# Patient Record
Sex: Male | Born: 1955 | Race: Black or African American | Hispanic: No | Marital: Married | Smoking: Never smoker
Health system: Southern US, Community
[De-identification: ages and names within clinical notes are randomized; demographics above are authoritative.]

## PROBLEM LIST (undated history)

## (undated) DIAGNOSIS — I10 Essential (primary) hypertension: Secondary | ICD-10-CM

## (undated) DIAGNOSIS — R569 Unspecified convulsions: Secondary | ICD-10-CM

## (undated) DIAGNOSIS — N400 Enlarged prostate without lower urinary tract symptoms: Secondary | ICD-10-CM

## (undated) DIAGNOSIS — H409 Unspecified glaucoma: Secondary | ICD-10-CM

## (undated) HISTORY — DX: Unspecified convulsions: R56.9

## (undated) HISTORY — DX: Benign prostatic hyperplasia without lower urinary tract symptoms: N40.0

## (undated) HISTORY — PX: HERNIA REPAIR: SHX51

## (undated) HISTORY — DX: Essential (primary) hypertension: I10

## (undated) HISTORY — DX: Unspecified glaucoma: H40.9

---

## 2002-04-10 ENCOUNTER — Encounter: Admission: RE | Admit: 2002-04-10 | Discharge: 2002-04-10 | Payer: Self-pay | Admitting: Family Medicine

## 2002-04-10 ENCOUNTER — Encounter: Payer: Self-pay | Admitting: Family Medicine

## 2004-06-21 ENCOUNTER — Emergency Department (HOSPITAL_COMMUNITY): Admission: EM | Admit: 2004-06-21 | Discharge: 2004-06-21 | Payer: Self-pay | Admitting: Emergency Medicine

## 2004-07-15 ENCOUNTER — Emergency Department (HOSPITAL_COMMUNITY): Admission: EM | Admit: 2004-07-15 | Discharge: 2004-07-15 | Payer: Self-pay | Admitting: Emergency Medicine

## 2006-05-12 IMAGING — CR DG ABDOMEN ACUTE W/ 1V CHEST
4 series · 4 of 4 positions shown · non-contrast
Comparison: No prior studies.

CLINICAL DATA: Right-sided abdominal pain and vomiting. 
ACUTE ABDOMEN SERIES:

[w chest pa]
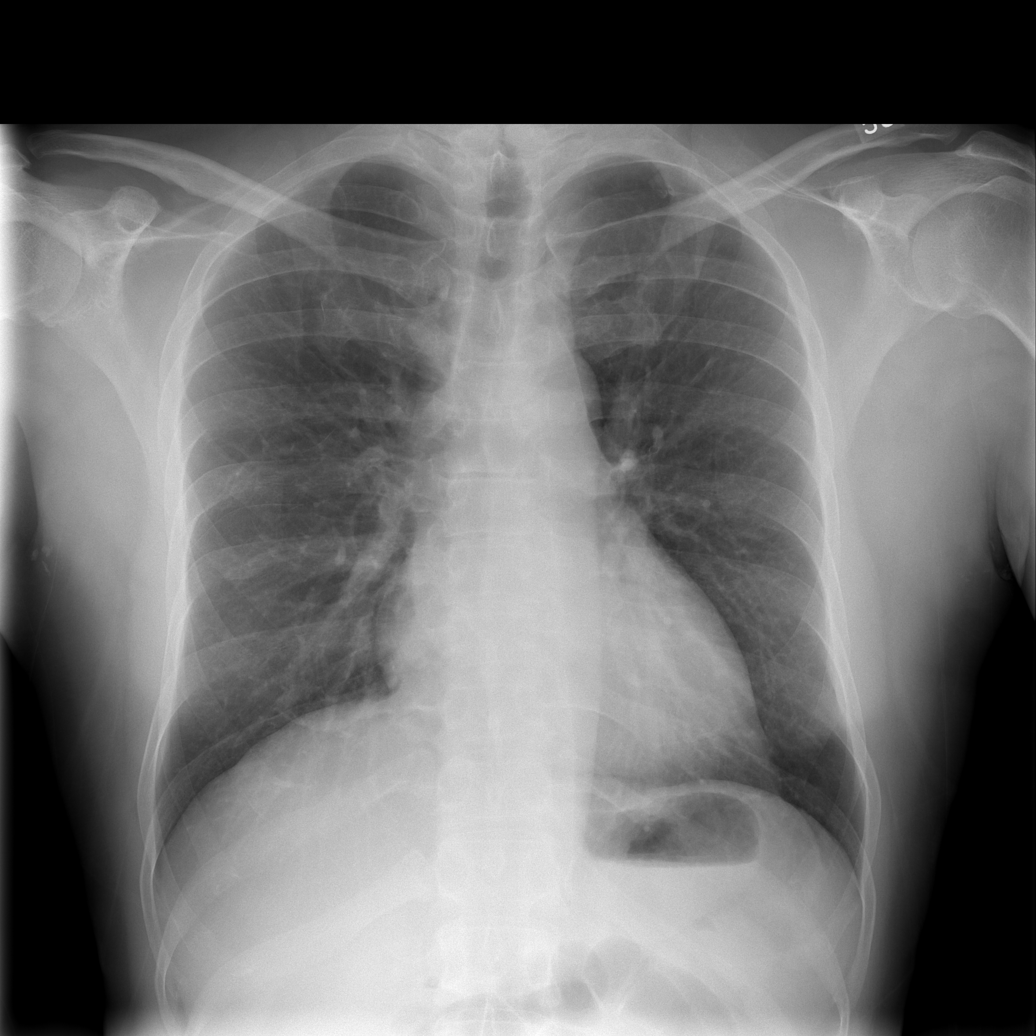

[w abdomen upright]
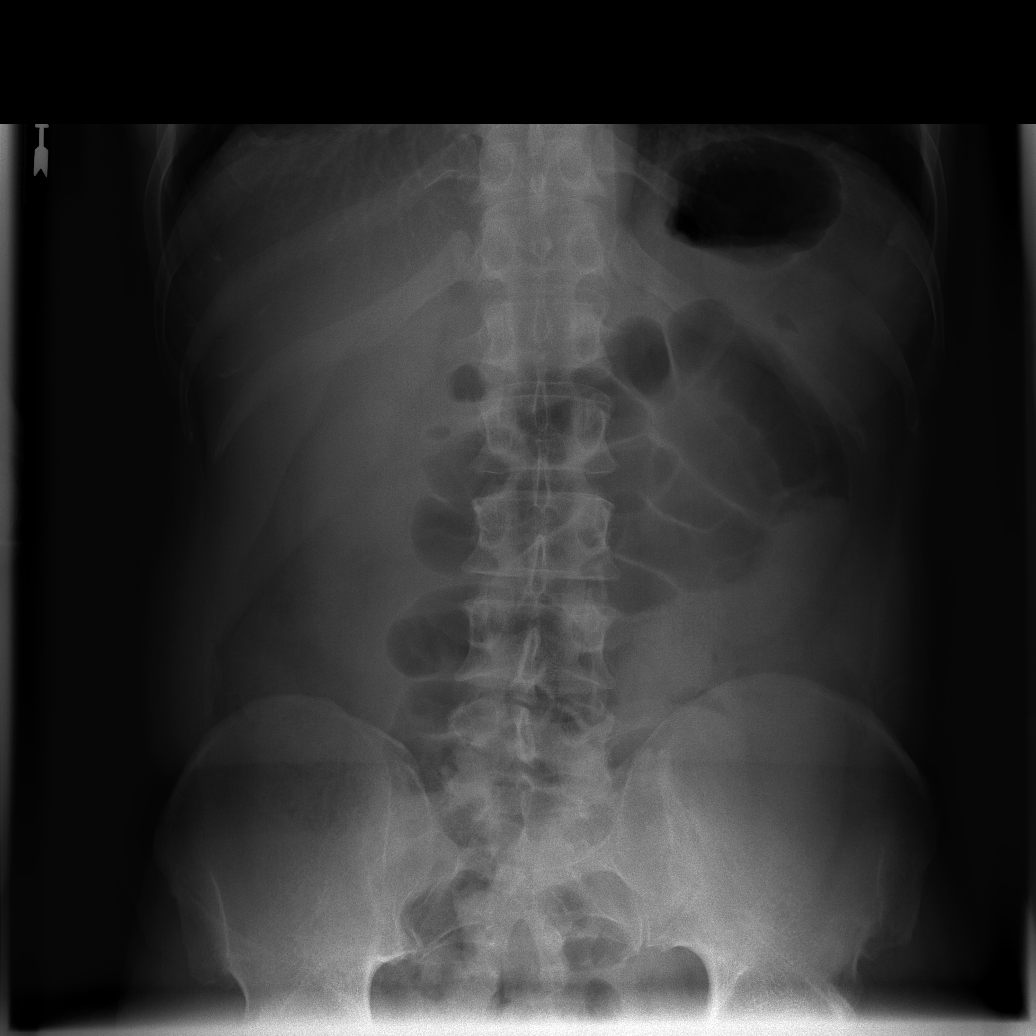

[t abdomen supine (1 of 2)]
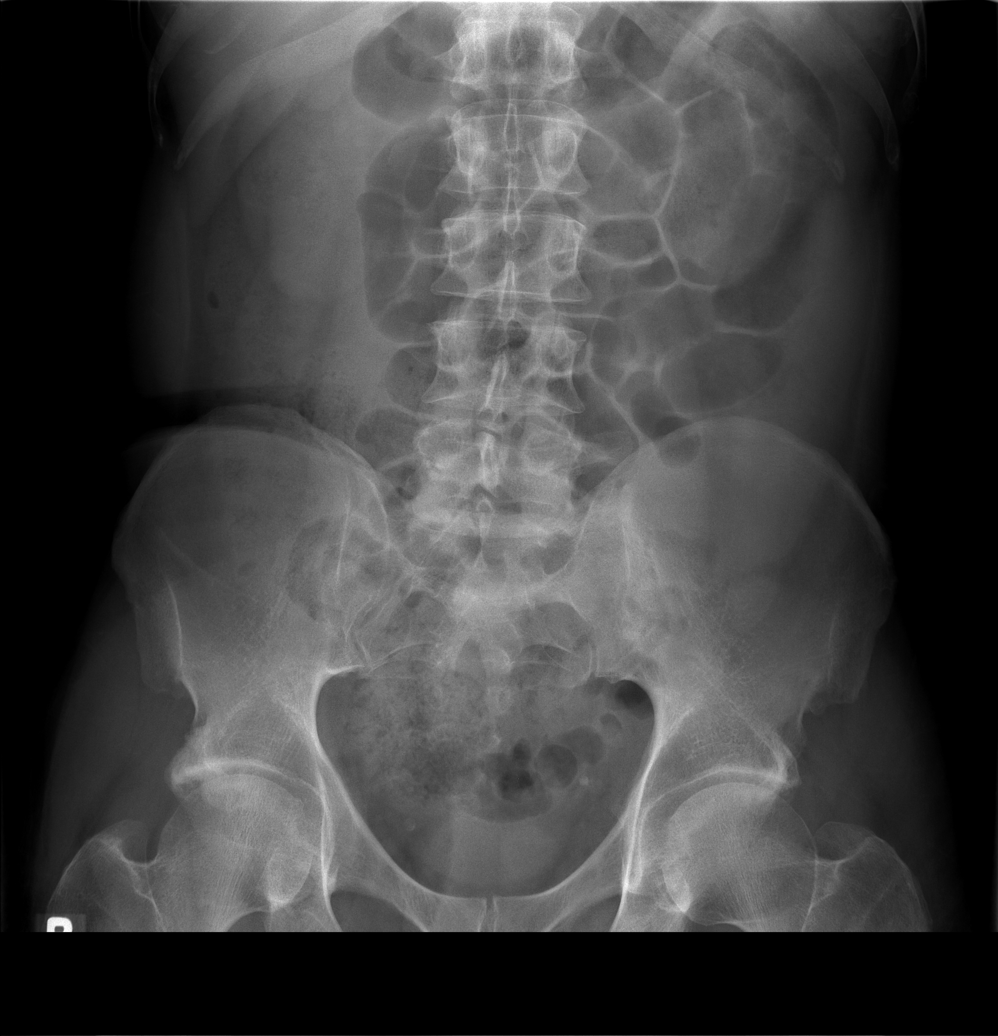

[t abdomen supine (2 of 2)]
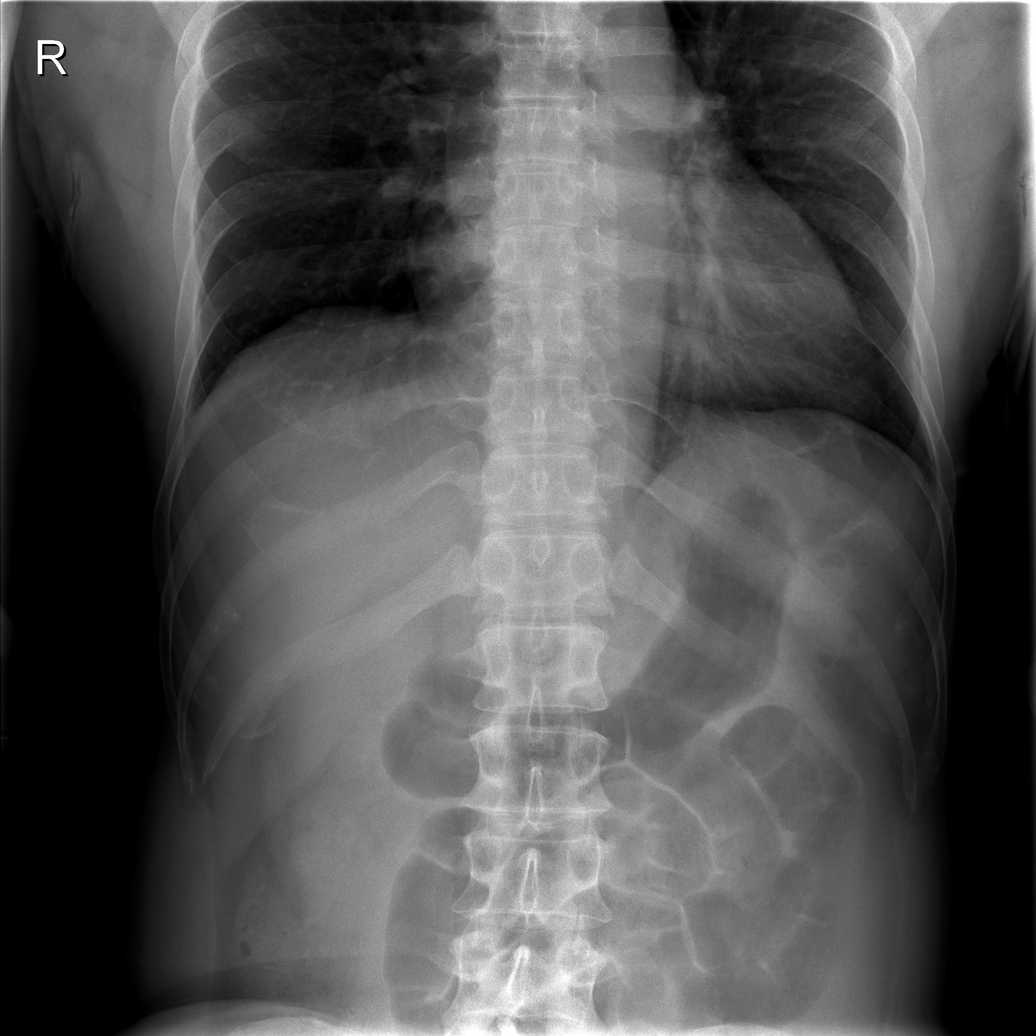

[4 of 4 positions shown; findings below may reference images not displayed]

FINDINGS: Heart and mediastinum appear unremarkable.   No evidence of free intraperitoneal gas beneath the hemidiaphragms. 
There are multiple air-filled nondilated loops of small bowel in the mid-abdomen.  There is evidence of stool in the colon.  The fat planes of the abdomen are well demonstrated and thus the centralized bowel loops are unlikely to be due to ascites.  No appendicolith is evident.  There is mild sclerosis of the right acetabulum laterally which is likely degenerative in nature.
IMPRESSION: Centralized nondilated loops of air-filled small bowel.  This is likely incidental.  If the degree of the patient?s abdominal pain warrants, CT may be helpful in further characterization.

## 2013-04-03 ENCOUNTER — Encounter: Payer: Self-pay | Admitting: Nurse Practitioner

## 2013-04-09 ENCOUNTER — Encounter: Payer: Self-pay | Admitting: Nurse Practitioner

## 2013-04-09 ENCOUNTER — Ambulatory Visit (INDEPENDENT_AMBULATORY_CARE_PROVIDER_SITE_OTHER): Payer: 59 | Admitting: Nurse Practitioner

## 2013-04-09 ENCOUNTER — Encounter (INDEPENDENT_AMBULATORY_CARE_PROVIDER_SITE_OTHER): Payer: Self-pay

## 2013-04-09 VITALS — BP 124/74 | HR 87 | Ht 73.75 in | Wt 212.0 lb

## 2013-04-09 DIAGNOSIS — G40209 Localization-related (focal) (partial) symptomatic epilepsy and epileptic syndromes with complex partial seizures, not intractable, without status epilepticus: Secondary | ICD-10-CM | POA: Insufficient documentation

## 2013-04-09 MED ORDER — CARBAMAZEPINE ER 300 MG PO CP12: 300.0000 mg | ORAL_CAPSULE | Freq: Two times a day (BID) | ORAL | Status: DC

## 2013-04-09 NOTE — Patient Instructions (Addendum)
Continue Carbitrol  brand 300mg  twice daily Call for any seizure activity F/U yearly and prn

## 2013-04-09 NOTE — Progress Notes (Signed)
[facility]  PATIENT: Darryl Fletcher DOB: 18-Jan-1956   REASON FOR VISIT: follow up for seizure   HISTORY OF PRESENT ILLNESS: Darryl Fletcher, 57 year old black male returns for followup. He has a history of complex partial seizure disorder currently well controlled on Carbatrol brand and no seizure activity in 15 years. His labs are drawn by his primary care doctor , Dr. Wynelle Link and he claims they were within normal limits at last check this year. He denies any balance issues he continues to work out 3 times a week he has not had any staring spells, any episodes of confusion, no daytime drowsiness. No new neurologic complaints.    HISTORY: He has a history of complex partial seizure disorder with no seizure activity in 55 years. Currently on Carbatrol tolerating the medication without any side effects. He has been on Dilantin and phenobarbital. Dilantin was discontinued due to problems with his gums. Denies any staring spells, any periods of confusion, no falls,no daytime drowsiness ,occassional dizziness, no headaches.  He has had no sleep disturbanc. See ROS.   REVIEW OF SYSTEMS: Full 14 system review of systems performed and notable only for those listed, all others are neg:  Constitutional: N/A  Cardiovascular: N/A  Ear/Nose/Throat: N/A  Skin: N/A  Eyes: N/A  Respiratory: N/A  Gastroitestinal: N/A  Hematology/Lymphatic: N/A  Endocrine: N/A Musculoskeletal:hip pain, aching muscles Allergy/Immunology: N/A  Neurological: N/A Psychiatric: N/A   ALLERGIES: No Known Allergies  HOME MEDICATIONS: Outpatient Prescriptions Prior to Visit  Medication Sig Dispense Refill  . carbamazepine (CARBATROL) 300 MG 12 hr capsule Take 300 mg by mouth 2 (two) times daily.      . dorzolamide-timolol (COSOPT) 22.3-6.8 MG/ML ophthalmic solution 1 drop 2 (two) times daily.      . hydrochlorothiazide (HYDRODIURIL) 25 MG tablet Take 25 mg by mouth daily.      . Travoprost, BAK Free,  (TRAVATAN) 0.004 % SOLN ophthalmic solution 1 drop at bedtime.      Marland Kitchen amLODipine (NORVASC) 10 MG tablet Take 10 mg by mouth daily.       No facility-administered medications prior to visit.    PAST MEDICAL HISTORY: Past Medical History  Diagnosis Date  . High blood pressure   . Seizures     PAST SURGICAL HISTORY: Past Surgical History  Procedure Laterality Date  . Hernia repair      FAMILY HISTORY: Family History  Problem Relation Age of Onset  . Colon cancer Father   . Diabetes Sister     SOCIAL HISTORY: History   Social History  . Marital Status: Married    Spouse Name: Byrd Hesselbach    Number of Children: 3  . Years of Education: 12   Occupational History  .  [employer]   Social History Main Topics  . Smoking status: Never Smoker   . Smokeless tobacco: Never Used  . Alcohol Use: No  . Drug Use: No  . Sexual Activity: Not on file   Other Topics Concern  . Not on file   Social History Narrative   Patient is married and lives at home with his wife and    Patient has three children.   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   Patient has a high school education.   Patient drinks caffeine- 3-4 times per week.           PHYSICAL EXAM  Filed Vitals:   04/09/13 1033  BP: 124/74  Pulse: 87  Height:  6' 1.75" (1.873 m)  Weight: 212 lb (96.163 kg)   Body mass index is 27.41 kg/(m^2).  Generalized: Well developed, in no acute distress  Neurological examination   Mentation: Alert oriented to time, place, history taking. Follows all commands speech and language fluent  Cranial nerve II-XII: Pupils were equal round reactive to light extraocular movements were full, visual field were full on confrontational test. Facial sensation and strength were normal. hearing was intact to finger rubbing bilaterally. Uvula tongue midline. head turning and shoulder shrug were normal and symmetric.Tongue protrusion into cheek strength was normal. Motor:  normal bulk and tone, full strength in the BUE, BLE, fine finger movements normal, no pronator drift. No focal weakness Coordination: finger-nose-finger, heel-to-shin bilaterally, no dysmetria Reflexes: Brachioradialis 2/2, biceps 2/2, triceps 2/2, patellar 2/2, Achilles 2/2, plantar responses were flexor bilaterally. Gait and Station: Rising up from seated position without assistance, normal stance,  moderate stride, good arm swing, smooth turning, able to perform tiptoe, and heel walking without difficulty. Tandem gait is steady  DIAGNOSTIC DATA (LABS, IMAGING, TESTING) None to review in EPIC patient to send [place] recent labs from Dr. Wynelle Link.   ASSESSMENT AND PLAN  57 y.o. year old male  has a past medical history of High blood pressure and Seizures. here to followup for seizure disorder. No seizures in 15 years.  Continue Carbitrol  brand 300mg  twice daily Call for any seizure activity F/U yearly and prn Nilda Riggs, [facility], [facility], APRN  [facility] [address], [address] [place], [place] [address] [phone]

## 2013-09-24 ENCOUNTER — Telehealth: Payer: Self-pay | Admitting: Nurse Practitioner

## 2013-09-24 MED ORDER — CARBAMAZEPINE ER 300 MG PO CP12: 300.0000 mg | ORAL_CAPSULE | Freq: Two times a day (BID) | ORAL | Status: DC

## 2013-09-24 NOTE — Telephone Encounter (Signed)
Patient requesting 30 day refill of carbamazepine (CARBATROL) 300 MG 12 hr capsule Rx sent to [place]  Going out of town and [facility] hasn't shipped medication.

## 2013-09-24 NOTE — Telephone Encounter (Signed)
Rx has been sent  

## 2014-01-28 ENCOUNTER — Telehealth: Payer: Self-pay | Admitting: *Deleted

## 2014-01-28 MED ORDER — CARBAMAZEPINE ER 300 MG PO CP12: 300.0000 mg | ORAL_CAPSULE | Freq: Two times a day (BID) | ORAL | Status: DC

## 2014-01-28 NOTE — Telephone Encounter (Signed)
Patient calling in to state that his pharmacy [facility] is taking a long time to get his medication Carbetrol 300 mg. Patient would like a Rx to last him til the end of the week until he gets his medication.

## 2014-01-28 NOTE — Telephone Encounter (Signed)
RX sent to local pharmacy. Please call patient

## 2014-01-29 NOTE — Telephone Encounter (Signed)
Called patient and relayed RX was sent.

## 2014-04-08 ENCOUNTER — Ambulatory Visit: Payer: Self-pay | Admitting: Nurse Practitioner

## 2014-04-27 ENCOUNTER — Other Ambulatory Visit: Payer: Self-pay | Admitting: Nurse Practitioner

## 2014-04-29 ENCOUNTER — Telehealth: Payer: Self-pay | Admitting: Nurse Practitioner

## 2014-04-29 MED ORDER — CARBAMAZEPINE ER 300 MG PO CP12: 300.0000 mg | ORAL_CAPSULE | Freq: Two times a day (BID) | ORAL | Status: DC

## 2014-04-29 NOTE — Telephone Encounter (Signed)
Patient stated Rx has expired for CARBATROL 300 MG 12 hr capsule.  Please forward new Rx refill to [place].  Please call and advise.

## 2014-04-29 NOTE — Telephone Encounter (Signed)
Rx has been sent.  I called back, phone rang several times with no answer, no voicemail.  Unable to leave message.

## 2014-05-25 ENCOUNTER — Other Ambulatory Visit: Payer: Self-pay | Admitting: Nurse Practitioner

## 2014-05-27 ENCOUNTER — Ambulatory Visit (INDEPENDENT_AMBULATORY_CARE_PROVIDER_SITE_OTHER): Payer: 59 | Admitting: Nurse Practitioner

## 2014-05-27 ENCOUNTER — Encounter: Payer: Self-pay | Admitting: Nurse Practitioner

## 2014-05-27 VITALS — BP 139/85 | HR 64 | Ht 73.0 in | Wt 218.0 lb

## 2014-05-27 DIAGNOSIS — G40209 Localization-related (focal) (partial) symptomatic epilepsy and epileptic syndromes with complex partial seizures, not intractable, without status epilepticus: Secondary | ICD-10-CM

## 2014-05-27 DIAGNOSIS — Z5181 Encounter for therapeutic drug level monitoring: Secondary | ICD-10-CM

## 2014-05-27 MED ORDER — CARBATROL 300 MG PO CP12: 300.0000 mg | ORAL_CAPSULE | Freq: Two times a day (BID) | ORAL | Status: DC

## 2014-05-27 NOTE — Progress Notes (Signed)
[facility]  PATIENT: Darryl Fletcher DOB: [birth year]   REASON FOR VISIT: Follow-up for seizure disorder , complex partial HISTORY FROM: Patient    HISTORY OF PRESENT ILLNESS:Darryl Fletcher, 59 year old black male returns for followup. He was last seen in this office 04/09/2013. He has a history of complex partial seizure disorder currently well controlled on Carbatrol brand and no seizure activity in 16.5 years. His labs are drawn by his primary care doctor , Dr. Tamasha Laplante Fletcher and he claims they were within normal limits at last check.  He denies any balance issues he continues to work out 3 times a week he has not had any staring spells, any episodes of confusion, no daytime drowsiness. He denies any side effects to the Carbatrol. He returns for reevaluation and monitoring of his blood level. No new neurologic complaints.    HISTORY: He has a history of complex partial seizure disorder with no seizure activity in 14 years. Currently on Carbatrol tolerating the medication without any side effects. He has been on Dilantin and phenobarbital. Dilantin was discontinued due to problems with his gums. Denies any staring spells, any periods of confusion, no falls,no daytime drowsiness ,occassional dizziness, no headaches. He has had no sleep disturbanc. See ROS.     REVIEW OF SYSTEMS: Full 14 system review of systems performed and notable only for those listed, all others are neg:  Constitutional: neg  Cardiovascular: neg Ear/Nose/Throat: neg  Skin: neg Eyes: neg Respiratory: neg Gastroitestinal: neg  Hematology/Lymphatic: neg  Endocrine: neg Musculoskeletal:neg Allergy/Immunology: neg Neurological: neg Psychiatric: neg Sleep : neg   ALLERGIES: No Known Allergies  HOME MEDICATIONS: Outpatient Prescriptions Prior to Visit  Medication Sig Dispense Refill  . amLODipine-benazepril (LOTREL) 10-20 MG per capsule 1 capsule daily.    Marland Kitchen CARBATROL 300 MG 12 hr capsule TAKE 1 CAPSULE  TWICE A DAY 60 capsule 0  . dorzolamide-timolol (COSOPT) 22.3-6.8 MG/ML ophthalmic solution 1 drop 2 (two) times daily.    . hydrochlorothiazide (HYDRODIURIL) 25 MG tablet Take 25 mg by mouth daily.    . Travoprost, BAK Free, (TRAVATAN) 0.004 % SOLN ophthalmic solution 1 drop at bedtime.     No facility-administered medications prior to visit.    PAST MEDICAL HISTORY: Past Medical History  Diagnosis Date  . High blood pressure   . Seizures     PAST SURGICAL HISTORY: Past Surgical History  Procedure Laterality Date  . Hernia repair      FAMILY HISTORY: Family History  Problem Relation Age of Onset  . Colon cancer Father   . Diabetes Sister     SOCIAL HISTORY: History   Social History  . Marital Status: Married    Spouse Name: Darryl Fletcher    Number of Children: 3  . Years of Education: 12   Occupational History  .  Darryl Fletcher   Social History Main Topics  . Smoking status: Never Smoker   . Smokeless tobacco: Never Used  . Alcohol Use: No  . Drug Use: No  . Sexual Activity: Not on file   Other Topics Concern  . Not on file   Social History Narrative   Patient is married and lives at home with his wife and    Patient has three children.   Patient works as a Administrator for [employer].   Patient has a high school education.   Patient drinks caffeine- 3-4 times per week.           PHYSICAL EXAM  Filed Vitals:  05/27/14 1525  BP: 139/85  Pulse: 64  Height: 6\' 1"  (1.854 m)  Weight: 218 lb (98.884 kg)   Body mass index is 28.77 kg/(m^2).  Generalized: Well developed, in no acute distress  Head: normocephalic and atraumatic,. Oropharynx benign  Neck: Supple, no carotid bruits  Cardiac: Regular rate rhythm, no murmur  Musculoskeletal: No deformity   Neurological examination   Mentation: Alert oriented to time, place, history taking. Attention span and concentration appropriate. Recent and remote memory intact.  Follows all commands speech  and language fluent.   Cranial nerve II-XII: Fundoscopic exam reveals sharp disc margins.Pupils were equal round reactive to light extraocular movements were full, visual field were full on confrontational test. Facial sensation and strength were normal. hearing was intact to finger rubbing bilaterally. Uvula tongue midline. head turning and shoulder shrug were normal and symmetric.Tongue protrusion into cheek strength was normal. Motor: normal bulk and tone, full strength in the BUE, BLE, fine finger movements normal, no pronator drift. No focal weakness Sensory: normal and symmetric to light touch, pinprick, and  Vibration, proprioception  Coordination: finger-nose-finger, heel-to-shin bilaterally, no dysmetria Reflexes: Brachioradialis 2/2, biceps 2/2, triceps 2/2, patellar 2/2, Achilles 2/2, plantar responses were flexor bilaterally. Gait and Station: Rising up from seated position without assistance, normal stance,  moderate stride, good arm swing, smooth turning, able to perform tiptoe, and heel walking without difficulty. Tandem gait is steady  DIAGNOSTIC DATA (LABS, IMAGING, TESTING) - I    ASSESSMENT AND PLAN  59 y.o. year old male  has a past medical history of High blood pressure and complex partial Seizures. here to follow-up. Last seizure activity 16.5 years ago Continue Carbatrol at current dose will refill Check carbamazepine level to ensure therapeutic dose Other labs followed by primary care Dr. Keishana Klinger Fletcher Call for any seizure activity Follow-up yearly and when necessary Darryl Bible, [facility], [facility], APRN  [facility] [address] [place], [place] [address] [phone]

## 2014-05-27 NOTE — Progress Notes (Signed)
I have read the note, and I agree with the clinical assessment and plan.  Nura Cahoon KEITH   

## 2014-05-27 NOTE — Patient Instructions (Signed)
Continue Carbatrol at current dose will refill Check carbamazepine level Other labs followed by primary care Dr. Nancy Fetter Call for any seizure activity Follow-up yearly and when necessary

## 2014-05-28 LAB — CARBAMAZEPINE LEVEL, TOTAL: CARBAMAZEPINE LVL: 7.5 ug/mL (ref 4.0–12.0)

## 2014-05-28 NOTE — Progress Notes (Signed)
Quick Note:  Called patient and spoke with him relayed Carbamazepine level was good. Patient understood. ______

## 2015-06-02 ENCOUNTER — Encounter: Payer: Self-pay | Admitting: Nurse Practitioner

## 2015-06-02 ENCOUNTER — Ambulatory Visit (INDEPENDENT_AMBULATORY_CARE_PROVIDER_SITE_OTHER): Payer: 59 | Admitting: Nurse Practitioner

## 2015-06-02 VITALS — BP 110/78 | HR 64 | Ht 73.0 in | Wt 214.8 lb

## 2015-06-02 DIAGNOSIS — G40209 Localization-related (focal) (partial) symptomatic epilepsy and epileptic syndromes with complex partial seizures, not intractable, without status epilepticus: Secondary | ICD-10-CM

## 2015-06-02 MED ORDER — CARBATROL 300 MG PO CP12: 300.0000 mg | ORAL_CAPSULE | Freq: Two times a day (BID) | ORAL | Status: DC

## 2015-06-02 NOTE — Progress Notes (Signed)
[facility]  PATIENT: Darryl Fletcher DOB: 1955/09/19   REASON FOR VISIT: Follow-up for epilepsy, complex partial seizure disorder HISTORY FROM: Patient    HISTORY OF PRESENT ILLNESS:Darryl Fletcher, 60 year old black male returns for followup. He was last seen in this office 06/04/2014 . He has a history of complex partial seizure disorder currently well controlled on Carbatrol brand and no seizure activity in 17.5 years. His labs are drawn by his primary care doctor , Dr. Zhion Pevehouse Fetter and he claims they were within normal limits at last check. He denies any balance issues he continues to work out 3 times a week he has not had any staring spells, any episodes of confusion, no daytime drowsiness. He denies any side effects to the Carbatrol. He returns for reevaluation. Last carbamazepine level was 7.5 one year ago  No new neurologic complaints.    HISTORY: He has a history of complex partial seizure disorder with no seizure activity in 14 years. Currently on Carbatrol tolerating the medication without any side effects. He has been on Dilantin and phenobarbital. Dilantin was discontinued due to problems with his gums. Denies any staring spells, any periods of confusion, no falls,no daytime drowsiness ,occassional dizziness, no headaches. He has had no sleep disturbanc. See ROS.   REVIEW OF SYSTEMS: Full 14 system review of systems performed and notable only for those listed, all others are neg:  Constitutional: neg  Cardiovascular: neg Ear/Nose/Throat: neg  Skin: neg Eyes: neg Respiratory: neg Gastroitestinal: neg  Hematology/Lymphatic: neg  Endocrine: neg Musculoskeletal: Back pain Allergy/Immunology: neg Neurological: neg Psychiatric: neg Sleep : neg   ALLERGIES: No Known Allergies  HOME MEDICATIONS: Outpatient Prescriptions Prior to Visit  Medication Sig Dispense Refill  . amLODipine-benazepril (LOTREL) 10-20 MG per capsule 1 capsule daily.    Marland Kitchen CARBATROL 300 MG 12  hr capsule Take 1 capsule (300 mg total) by mouth 2 (two) times daily. 180 capsule 3  . dorzolamide-timolol (COSOPT) 22.3-6.8 MG/ML ophthalmic solution 1 drop 2 (two) times daily.    . hydrochlorothiazide (HYDRODIURIL) 25 MG tablet Take 25 mg by mouth daily.    . Travoprost, BAK Free, (TRAVATAN) 0.004 % SOLN ophthalmic solution 1 drop at bedtime.     No facility-administered medications prior to visit.    PAST MEDICAL HISTORY: Past Medical History  Diagnosis Date  . High blood pressure   . Seizures ([place])     PAST SURGICAL HISTORY: Past Surgical History  Procedure Laterality Date  . Hernia repair      FAMILY HISTORY: Family History  Problem Relation Age of Onset  . Colon cancer Father   . Diabetes Sister     SOCIAL HISTORY: Social History   Social History  . Marital Status: Married    Spouse Name: [name]  . Number of Children: 3  . Years of Education: 12   Occupational History  .  [employer]   Social History Main Topics  . Smoking status: Never Smoker   . Smokeless tobacco: Never Used  . Alcohol Use: No  . Drug Use: No  . Sexual Activity: Not on file   Other Topics Concern  . Not on file   Social History Narrative   Patient is married and lives at home with his wife and    Patient has three children.   Patient works as a Administrator for [employer].   Patient has a high school education.   Patient drinks caffeine- 3-4 times per week.  PHYSICAL EXAM  Filed Vitals:   06/02/15 0919  BP: 110/78  Pulse: 64  Height: 6\' 1"  (1.854 m)  Weight: 214 lb 12 oz (97.41 kg)   Body mass index is 28.34 kg/(m^2). Generalized: Well developed, in no acute distress  Head: normocephalic and atraumatic,. Oropharynx benign  Neck: Supple, no carotid bruits  Cardiac: Regular rate rhythm, no murmur  Musculoskeletal: No deformity   Neurological examination   Mentation: Alert oriented to time, place, history taking. Attention span and  concentration appropriate. Recent and remote memory intact. Follows all commands speech and language fluent.   Cranial nerve II-XII: Pupils were equal round reactive to light extraocular movements were full, visual field were full on confrontational test. Facial sensation and strength were normal. hearing was intact to finger rubbing bilaterally. Uvula tongue midline. head turning and shoulder shrug were normal and symmetric.Tongue protrusion into cheek strength was normal. Motor: normal bulk and tone, full strength in the BUE, BLE, fine finger movements normal, no pronator drift. No focal weakness Sensory: normal and symmetric to light touch, pinprick, and Vibration, proprioception  Coordination: finger-nose-finger, heel-to-shin bilaterally, no dysmetria Reflexes: Brachioradialis 2/2, biceps 2/2, triceps 2/2, patellar 2/2, Achilles 2/2, plantar responses were flexor bilaterally. Gait and Station: Rising up from seated position without assistance, normal stance, moderate stride, good arm swing, smooth turning, able to perform tiptoe, and heel walking without difficulty. Tandem gait is steady  DIAGNOSTIC DATA (LABS, IMAGING, TESTING) - ASSESSMENT AND PLAN 60 y.o. year old male has a past medical history of High blood pressure and complex partial Seizures, here to follow-up. Last seizure activity 17.5 years ago   Continue Carbatrol at current dose will refill Other labs followed by primary care Dr. Collins Dimaria Fetter Call for any seizure activity Follow-up yearly and when necessary Dennie Bible, [facility], [facility], APRN  [facility] [address] [place], [place] [address] [phone]

## 2015-06-02 NOTE — Progress Notes (Signed)
I have read the note, and I agree with the clinical assessment and plan.  Duante Arocho KEITH   

## 2015-06-02 NOTE — Patient Instructions (Signed)
Continue Carbatrol at current dose will refill Other labs followed by primary care Dr. Abdulah Iqbal Fetter Call for any seizure activity Follow-up yearly and when necessary

## 2016-02-11 ENCOUNTER — Ambulatory Visit (INDEPENDENT_AMBULATORY_CARE_PROVIDER_SITE_OTHER): Payer: 59 | Admitting: Sports Medicine

## 2016-02-11 DIAGNOSIS — M25561 Pain in right knee: Secondary | ICD-10-CM | POA: Diagnosis not present

## 2016-05-14 DIAGNOSIS — J069 Acute upper respiratory infection, unspecified: Secondary | ICD-10-CM | POA: Diagnosis not present

## 2016-05-16 ENCOUNTER — Other Ambulatory Visit: Payer: Self-pay | Admitting: Nurse Practitioner

## 2016-05-31 ENCOUNTER — Encounter: Payer: Self-pay | Admitting: Nurse Practitioner

## 2016-05-31 ENCOUNTER — Ambulatory Visit (INDEPENDENT_AMBULATORY_CARE_PROVIDER_SITE_OTHER): Payer: 59 | Admitting: Nurse Practitioner

## 2016-05-31 VITALS — BP 124/76 | HR 72 | Ht 73.0 in | Wt 211.8 lb

## 2016-05-31 DIAGNOSIS — G40209 Localization-related (focal) (partial) symptomatic epilepsy and epileptic syndromes with complex partial seizures, not intractable, without status epilepticus: Secondary | ICD-10-CM | POA: Diagnosis not present

## 2016-05-31 DIAGNOSIS — Z5181 Encounter for therapeutic drug level monitoring: Secondary | ICD-10-CM | POA: Diagnosis not present

## 2016-05-31 MED ORDER — CARBATROL 300 MG PO CP12: 300.0000 mg | ORAL_CAPSULE | Freq: Two times a day (BID) | ORAL | 3 refills | Status: DC

## 2016-05-31 NOTE — Progress Notes (Signed)
[facility]  PATIENT: Darryl Fletcher DOB: 1955/12/15   REASON FOR VISIT: Follow-up for epilepsy, complex partial seizure disorder HISTORY FROM: Patient    HISTORY OF PRESENT ILLNESS:UPDATE 05/31/2016 DarrylMr. Fletcher, 61 year old black male returns for yearly followup. He has a history of complex partial seizure disorder currently well controlled on Carbatrol brand and no seizure activity in 18.5 years. His labs are drawn by his primary care doctor , Darryl Fletcher and he claims they were within normal limits at last check. He denies any balance issues,,  any staring spells, any episodes of confusion, no daytime drowsiness. He denies any side effects to the Carbatrol. He continues to work out 3 times a week He returns for reevaluation. He had previously been on Dilantin but  had difficulty with his gums.  REVIEW OF SYSTEMS: Full 14 system review of systems performed and notable only for those listed, all others are neg:  Constitutional: neg  Cardiovascular: neg Ear/Nose/Throat: neg  Skin: neg Eyes: neg Respiratory: neg Gastroitestinal: neg  Hematology/Lymphatic: neg  Endocrine: neg Musculoskeletal: neg Allergy/Immunology: neg Neurological: neg Psychiatric: neg Sleep : neg   ALLERGIES: No Known Allergies  HOME MEDICATIONS: Outpatient Medications Prior to Visit  Medication Sig Dispense Refill  . amLODipine-benazepril (LOTREL) 10-20 MG per capsule 1 capsule daily.    Marland Kitchen CARBATROL 300 MG 12 hr capsule Take 1 capsule (300 mg total) by mouth 2 (two) times daily. 180 capsule 3  . COMBIGAN 0.2-0.5 % ophthalmic solution     . dorzolamide-timolol (COSOPT) 22.3-6.8 MG/ML ophthalmic solution 1 drop 2 (two) times daily.    . hydrochlorothiazide (HYDRODIURIL) 25 MG tablet Take 25 mg by mouth daily.    . Travoprost, BAK Free, (TRAVATAN) 0.004 % SOLN ophthalmic solution 1 drop at bedtime.     No facility-administered medications prior to visit.     PAST MEDICAL HISTORY: Past  Medical History:  Diagnosis Date  . High blood pressure   . Seizures ([place])     PAST SURGICAL HISTORY: Past Surgical History:  Procedure Laterality Date  . HERNIA REPAIR      FAMILY HISTORY: Family History  Problem Relation Age of Onset  . Colon cancer Father   . Diabetes Sister     SOCIAL HISTORY: Social History   Social History  . Marital status: Married    Spouse name: Darryl Fletcher  . Number of children: 3  . Years of education: 12   Occupational History  .  Darryl Fletcher   Social History Main Topics  . Smoking status: Never Smoker  . Smokeless tobacco: Never Used  . Alcohol use No  . Drug use: No  . Sexual activity: Not on file   Other Topics Concern  . Not on file   Social History Narrative   Patient is married and lives at home with his wife and    Patient has three children.   Patient works as a Administrator for [employer].   Patient has a high school education.   Patient drinks caffeine- 3-4 times per week.           PHYSICAL EXAM  Vitals:   05/31/16 0832  BP: 124/76  Pulse: 72  Weight: 211 lb 12.8 oz (96.1 kg)  Height: 6\' 1"  (1.854 m)   Body mass index is 27.94 kg/m. Generalized: Well developed, in no acute distress  Head: normocephalic and atraumatic,. Oropharynx benign  Musculoskeletal: No deformity   Neurological examination   Mentation: Alert oriented to time, place,  history taking. Attention span and concentration appropriate. Recent and remote memory intact. Follows all commands speech and language fluent.   Cranial nerve II-XII: Pupils were equal round reactive to light extraocular movements were full, visual field were full on confrontational test. Facial sensation and strength were normal. hearing was intact to finger rubbing bilaterally. Uvula tongue midline. head turning and shoulder shrug were normal and symmetric.Tongue protrusion into cheek strength was normal. Motor: normal bulk and tone, full strength in the BUE,  BLE, fine finger movements normal, no pronator drift. No focal weakness Sensory: normal and symmetric to light touch, pinprick, and Vibration, in the upper and lower extremities Coordination: finger-nose-finger, heel-to-shin bilaterally, no dysmetria Reflexes: Symmetric upper and lower plantar responses were flexor bilaterally. Gait and Station: Rising up from seated position without assistance, normal stance, moderate stride, good arm swing, smooth turning, able to perform tiptoe, and heel walking without difficulty. Tandem gait is steady  DIAGNOSTIC DATA (LABS, IMAGING, TESTING) - ASSESSMENT AND PLAN 61 y.o. year old male has a past medical history of High blood pressure and complex partial Seizures, here to follow-up. Last seizure activity 18.5 years ago.  PLAN: Will check Carbatrol level Continue Carbatrol at current dose will refill Other labs followed by primary care Darryl Fletcher Call for any seizure activity Follow-up yearly and when necessary I spent 15 min in total face to face time with the patient more than 50% of which was spent counseling and coordination of care, reviewing test results reviewing medications and discussing and reviewing the diagnosis of seizure disorder.  Dennie Bible, [facility], [facility], APRN  [facility] [address] [place], [place] [address] [phone]

## 2016-05-31 NOTE — Patient Instructions (Signed)
Will check Carbatrol level Continue Carbatrol at current dose will refill Call for any seizure activity Follow-up yearly and when necessary

## 2016-05-31 NOTE — Progress Notes (Signed)
I have read the note, and I agree with the clinical assessment and plan.  Deeanne Deininger KEITH   

## 2016-06-01 ENCOUNTER — Telehealth: Payer: Self-pay | Admitting: *Deleted

## 2016-06-01 LAB — CARBAMAZEPINE LEVEL, TOTAL: Carbamazepine (Tegretol), S: 13 ug/mL (ref 4.0–12.0)

## 2016-06-01 NOTE — Telephone Encounter (Signed)
I called and spoke to wife, and relayed the lab results.  Pts takes med at 1000 and 8p.  He had not taken his medication prior to having lab drawn.  Pt is asleep right and could not confer.  She relayed understanding of lab results.  Did think it was a trough level.  If anything else needs to be done to let her know. [name]).

## 2016-06-01 NOTE — Telephone Encounter (Signed)
There were no signs of toxicity on exam. Leave medication as is

## 2016-06-01 NOTE — Telephone Encounter (Signed)
-----   Message from Dennie Bible, NP sent at 06/01/2016  7:49 AM EST ----- Labs ok not trough please call

## 2016-06-07 DIAGNOSIS — L918 Other hypertrophic disorders of the skin: Secondary | ICD-10-CM | POA: Diagnosis not present

## 2016-06-07 DIAGNOSIS — I10 Essential (primary) hypertension: Secondary | ICD-10-CM | POA: Diagnosis not present

## 2016-06-07 DIAGNOSIS — Z23 Encounter for immunization: Secondary | ICD-10-CM | POA: Diagnosis not present

## 2016-07-16 ENCOUNTER — Ambulatory Visit (INDEPENDENT_AMBULATORY_CARE_PROVIDER_SITE_OTHER): Payer: 59

## 2016-07-16 ENCOUNTER — Ambulatory Visit (INDEPENDENT_AMBULATORY_CARE_PROVIDER_SITE_OTHER): Payer: 59 | Admitting: Family

## 2016-07-16 ENCOUNTER — Encounter (INDEPENDENT_AMBULATORY_CARE_PROVIDER_SITE_OTHER): Payer: Self-pay | Admitting: Orthopedic Surgery

## 2016-07-16 VITALS — Ht 73.0 in | Wt 211.0 lb

## 2016-07-16 DIAGNOSIS — M25512 Pain in left shoulder: Secondary | ICD-10-CM

## 2016-07-16 DIAGNOSIS — M79672 Pain in left foot: Secondary | ICD-10-CM | POA: Diagnosis not present

## 2016-07-16 DIAGNOSIS — G8929 Other chronic pain: Secondary | ICD-10-CM | POA: Diagnosis not present

## 2016-07-16 NOTE — Progress Notes (Signed)
Office Visit Note   Patient: Darryl Fletcher           Date of Birth: Sep 07, 1955           MRN: [id number] Visit Date: 07/16/2016              Requested by: Donald Prose, MD [place], [place] [address] PCP: Lynne Logan, MD  Chief Complaint  Patient presents with  . Left Foot - Pain  . Left Shoulder - Pain    HPI: Patient is a 61 year old gentleman seen for evaluation of 2 separate issues.   Here for evaluation of left shoulder pain. Complains of decreased range of motion. Pain with above head reaching. Ongoing for last month. No known injury. No radicular pain.   Left foot pain over the dorsum of his foot. States was tender to palpation at first. States is no longer. Complains of swelling. No warmth. No history of gout. Pain with ambulation, start up. Points to heel has painful area in the morning. Some pain around the lateral foot points to lateral calcaneus.   Assessment & Plan: Visit Diagnoses:  1. Chronic left shoulder pain   2. Pain in left foot     Plan: injection left feel, left shoulder. Follow up in 4 more weeks. Advised good arch supports for plantar fasciitis.   Follow-Up Instructions: Return in about 4 weeks (around 08/13/2016).   Physical Exam  Constitutional: Appears well-developed.  Head: Normocephalic.  Eyes: EOM are normal.  Neck: Normal range of motion.  Cardiovascular: Normal rate.   Pulmonary/Chest: Effort normal.  Neurological: Is alert.  Skin: Skin is warm.  Psychiatric: Has a normal mood and affect.  Left Shoulder Exam   Tenderness  The patient is experiencing tenderness in the biceps tendon.  Range of Motion  The patient has normal left shoulder ROM.  Muscle Strength  The patient has normal left shoulder strength.  Tests  Drop Arm: negative Impingement: positive      Left foot: is plantigrade. Does have tenderness over insertion of peroneal tendon. No pain with lateral compression of calcaneus. Tender at  origin of plantar fascia. No swelling today.   Imaging: Xr Foot Complete Left  Result Date: 07/16/2016 Radiographs of left foot are negative for fracture. No acute finding.    Labs: No results found for: HGBA1C, ESRSEDRATE, CRP, LABURIC, REPTSTATUS, GRAMSTAIN, CULT, LABORGA  Orders:  Orders Placed This Encounter  Procedures  . XR Foot Complete Left  . XR Shoulder Left   No orders of the defined types were placed in this encounter.    Procedures: Large Joint Inj Date/Time: 07/19/2016 8:44 AM Performed by: Suzan Slick Authorized by: Dondra Prader R   Consent Given by:  Patient Site marked: the procedure site was marked   Timeout: prior to procedure the correct patient, procedure, and site was verified   Indications:  Pain and diagnostic evaluation Location:  Shoulder Site:  L subacromial bursa Prep: patient was prepped and draped in usual sterile fashion   Needle Size:  22 G Needle Length:  1.5 inches Ultrasound Guidance: No   Fluoroscopic Guidance: No   Arthrogram: No   Medications:  5 mL lidocaine 1 %; 40 mg methylPREDNISolone acetate 40 MG/ML Aspiration Attempted: No   Patient tolerance:  Patient tolerated the procedure well with no immediate complications Foot Inj Date/Time: 07/19/2016 8:45 AM Performed by: Suzan Slick Authorized by: Dondra Prader R   Consent Given by:  Patient Site marked: the procedure site was marked   Timeout: prior to procedure the correct patient, procedure, and site was verified   Indications:  Fasciitis and pain Condition: Plantar Fasciitis   Location: left plantar fascia muscle   Prep: patient was prepped and draped in usual sterile fashion   Needle Size:  22 G Medications:  2 mL lidocaine 1 %; 40 mg methylPREDNISolone acetate 40 MG/ML Patient Tolerance:  Patient tolerated the procedure well with no immediate complications    Clinical Data: No additional findings.  ROS: Review of Systems  Constitutional: Negative for chills  and fever.  Cardiovascular: Negative for leg swelling.  Musculoskeletal: Positive for arthralgias, gait problem and myalgias.    Objective: Vital Signs: Ht 6\' 1"  (1.854 m)   Wt 211 lb (95.7 kg)   BMI 27.84 kg/m   Specialty Comments:  No specialty comments available.  PMFS History: Patient Active Problem List   Diagnosis Date Noted  . Partial epilepsy with impairment of consciousness ([place]) 04/09/2013   Past Medical History:  Diagnosis Date  . High blood pressure   . Seizures ([place])     Family History  Problem Relation Age of Onset  . Colon cancer Father   . Diabetes Sister     Past Surgical History:  Procedure Laterality Date  . HERNIA REPAIR     Social History   Occupational History  .  [employer]   Social History Main Topics  . Smoking status: Never Smoker  . Smokeless tobacco: Never Used  . Alcohol use No  . Drug use: No  . Sexual activity: Not on file

## 2016-07-19 DIAGNOSIS — M25512 Pain in left shoulder: Secondary | ICD-10-CM | POA: Diagnosis not present

## 2016-07-19 DIAGNOSIS — G8929 Other chronic pain: Secondary | ICD-10-CM | POA: Diagnosis not present

## 2016-07-19 MED ORDER — LIDOCAINE HCL 1 % IJ SOLN
2.0000 mL | INTRAMUSCULAR | Status: AC | PRN
  Administered 2016-07-19: 2 mL

## 2016-07-19 MED ORDER — LIDOCAINE HCL 1 % IJ SOLN
5.0000 mL | INTRAMUSCULAR | Status: AC | PRN
  Administered 2016-07-19: 5 mL

## 2016-07-19 MED ORDER — METHYLPREDNISOLONE ACETATE 40 MG/ML IJ SUSP
40.0000 mg | INTRAMUSCULAR | Status: AC | PRN
  Administered 2016-07-19: 40 mg

## 2016-07-19 MED ORDER — METHYLPREDNISOLONE ACETATE 40 MG/ML IJ SUSP
40.0000 mg | INTRAMUSCULAR | Status: AC | PRN
  Administered 2016-07-19: 40 mg via INTRA_ARTICULAR

## 2016-08-09 ENCOUNTER — Ambulatory Visit (INDEPENDENT_AMBULATORY_CARE_PROVIDER_SITE_OTHER): Payer: 59 | Admitting: Orthopedic Surgery

## 2016-08-16 ENCOUNTER — Encounter (INDEPENDENT_AMBULATORY_CARE_PROVIDER_SITE_OTHER): Payer: Self-pay | Admitting: Orthopedic Surgery

## 2016-08-16 ENCOUNTER — Ambulatory Visit (INDEPENDENT_AMBULATORY_CARE_PROVIDER_SITE_OTHER): Payer: 59 | Admitting: Orthopedic Surgery

## 2016-08-16 VITALS — Ht 73.0 in | Wt 211.0 lb

## 2016-08-16 DIAGNOSIS — M25512 Pain in left shoulder: Secondary | ICD-10-CM | POA: Diagnosis not present

## 2016-08-16 DIAGNOSIS — M722 Plantar fascial fibromatosis: Secondary | ICD-10-CM

## 2016-08-16 NOTE — Progress Notes (Signed)
Office Visit Note   Patient: Darryl Fletcher           Date of Birth: [birth year]           MRN: [id number] Visit Date: 08/16/2016              Requested by: Darryl Prose, MD [place], [place] [address] PCP: Darryl Logan, MD  Chief Complaint  Patient presents with  . Left Shoulder - Follow-up  . Left Foot - Follow-up    HPI: Patient is a 61 year old gentleman seen in follow up 2 separate issues.   Here for evaluation of left shoulder pain. Has had Depomedrol injection 4 weeks ago. Full range of motion. Pain with above head reaching. No radicular pain. Continued pain with pushing. States he has a new job where he no longer has to lift and push very often has had produced symptoms. Does not have time in his schedule for physical therapy. Is pleased with his progress.  Also seen in follow-up for plantar fasciitis left foot. This is now pain free he is 4 weeks out from cortisone injection in his heel. Will continue with arch supports in his work boots.  Assessment & Plan: Visit Diagnoses:  1. Acute pain of left shoulder   2. Plantar fasciitis     Plan: injection left feel, left shoulder. Follow up in 4 more weeks. Advised good arch supports for plantar fasciitis.   Follow-Up Instructions: Return if symptoms worsen or fail to improve.   Physical Exam  Constitutional: Appears well-developed.  Head: Normocephalic.  Eyes: EOM are normal.  Neck: Normal range of motion.  Cardiovascular: Normal rate.   Pulmonary/Chest: Effort normal.  Neurological: Is alert.  Skin: Skin is warm.  Psychiatric: Has a normal mood and affect.  Left Shoulder Exam   Tenderness  The patient is experiencing tenderness in the biceps tendon.  Range of Motion  The patient has normal left shoulder ROM.  Muscle Strength  The patient has normal left shoulder strength.  Tests  Drop Arm: negative Impingement: negative      Left foot: is plantigrade. No pain with lateral  compression of calcaneus. nontender at origin of plantar fascia. No swelling today.   Imaging: No results found.  Labs: No results found for: HGBA1C, ESRSEDRATE, CRP, LABURIC, REPTSTATUS, GRAMSTAIN, CULT, LABORGA  Orders:  No orders of the defined types were placed in this encounter.  No orders of the defined types were placed in this encounter.    Procedures: No procedures performed  Clinical Data: No additional findings.  ROS: Review of Systems  Constitutional: Negative for chills and fever.  Cardiovascular: Negative for leg swelling.  Musculoskeletal: Positive for arthralgias, gait problem and myalgias.    Objective: Vital Signs: Ht 6\' 1"  (1.854 m)   Wt 211 lb (95.7 kg)   BMI 27.84 kg/m   Specialty Comments:  No specialty comments available.  PMFS History: Patient Active Problem List   Diagnosis Date Noted  . Partial epilepsy with impairment of consciousness ([place]) 04/09/2013   Past Medical History:  Diagnosis Date  . High blood pressure   . Seizures ([place])     Family History  Problem Relation Age of Onset  . Colon cancer Father   . Diabetes Sister     Past Surgical History:  Procedure Laterality Date  . HERNIA REPAIR     Social History   Occupational History  .  Darryl Fletcher   Social History Main  Topics  . Smoking status: Never Smoker  . Smokeless tobacco: Never Used  . Alcohol use No  . Drug use: No  . Sexual activity: Not on file

## 2016-08-30 DIAGNOSIS — H3589 Other specified retinal disorders: Secondary | ICD-10-CM | POA: Diagnosis not present

## 2016-08-30 DIAGNOSIS — H401111 Primary open-angle glaucoma, right eye, mild stage: Secondary | ICD-10-CM | POA: Diagnosis not present

## 2016-08-30 DIAGNOSIS — H401122 Primary open-angle glaucoma, left eye, moderate stage: Secondary | ICD-10-CM | POA: Diagnosis not present

## 2016-12-06 DIAGNOSIS — E7800 Pure hypercholesterolemia, unspecified: Secondary | ICD-10-CM | POA: Diagnosis not present

## 2016-12-06 DIAGNOSIS — Z23 Encounter for immunization: Secondary | ICD-10-CM | POA: Diagnosis not present

## 2016-12-06 DIAGNOSIS — I10 Essential (primary) hypertension: Secondary | ICD-10-CM | POA: Diagnosis not present

## 2017-01-03 DIAGNOSIS — H401122 Primary open-angle glaucoma, left eye, moderate stage: Secondary | ICD-10-CM | POA: Diagnosis not present

## 2017-01-03 DIAGNOSIS — H53432 Sector or arcuate defects, left eye: Secondary | ICD-10-CM | POA: Diagnosis not present

## 2017-01-03 DIAGNOSIS — H401111 Primary open-angle glaucoma, right eye, mild stage: Secondary | ICD-10-CM | POA: Diagnosis not present

## 2017-02-11 DIAGNOSIS — Z23 Encounter for immunization: Secondary | ICD-10-CM | POA: Diagnosis not present

## 2017-05-30 ENCOUNTER — Ambulatory Visit: Payer: 59 | Admitting: Neurology

## 2017-05-31 ENCOUNTER — Ambulatory Visit: Payer: 59 | Admitting: Nurse Practitioner

## 2017-06-06 ENCOUNTER — Ambulatory Visit: Payer: 59 | Admitting: Nurse Practitioner

## 2017-06-06 DIAGNOSIS — I10 Essential (primary) hypertension: Secondary | ICD-10-CM | POA: Diagnosis not present

## 2017-06-20 ENCOUNTER — Other Ambulatory Visit: Payer: Self-pay | Admitting: Nurse Practitioner

## 2017-07-21 NOTE — Progress Notes (Signed)
[facility]  PATIENT: Darryl Fletcher DOB: 24-May-1955   REASON FOR VISIT: Follow-up for epilepsy, complex partial seizure disorder new complaint of witnessed apnea by wife, patient works third shift HISTORY FROM: Patient    HISTORY OF PRESENT ILLNESS:UPDATE 07/25/2017 CM.Darryl Fletcher, 62 year old black male returns for yearly followup. He has a history of complex partial seizure disorder currently well controlled on Carbatrol brand and no seizure activity in 19.5 years. His labs are drawn by his primary care doctor , Dr. Lavar Rosenzweig Fetter and he claims they were within normal limits at last check. He denies any balance issues,,  any staring spells, any episodes of confusion,  He denies any side effects to the Carbatrol. He continues to work out 3 times a week.  He has no new complaints of witnessed apneas by his wife, and he is wanting to have a sleep study.  He also works third shift.  He denies any morning headaches, he returns for reevaluation. He had previously been on Dilantin but  had difficulty with his gums.  REVIEW OF SYSTEMS: Full 14 system review of systems performed and notable only for those listed, all others are neg:  Constitutional: neg  Cardiovascular: neg Ear/Nose/Throat: neg  Skin: neg Eyes: neg Respiratory: neg Gastroitestinal: neg  Hematology/Lymphatic: neg  Endocrine: neg Musculoskeletal: neg Allergy/Immunology: neg Neurological: History of seizure disorder well-controlled Psychiatric: neg Sleep : Witnessed apneas by wife   ALLERGIES: No Known Allergies  HOME MEDICATIONS: Outpatient Medications Prior to Visit  Medication Sig Dispense Refill  . amLODipine-benazepril (LOTREL) 10-20 MG per capsule 1 capsule daily.    Marland Kitchen CARBATROL 300 MG 12 hr capsule TAKE 1 CAPSULE BY MOUTH TWO TIMES DAILY 180 capsule 3  . COMBIGAN 0.2-0.5 % ophthalmic solution     . dorzolamide-timolol (COSOPT) 22.3-6.8 MG/ML ophthalmic solution 1 drop 2 (two) times daily.    .  hydrochlorothiazide (HYDRODIURIL) 25 MG tablet Take 25 mg by mouth daily.    . Travoprost, BAK Free, (TRAVATAN) 0.004 % SOLN ophthalmic solution 1 drop at bedtime.     No facility-administered medications prior to visit.     PAST MEDICAL HISTORY: Past Medical History:  Diagnosis Date  . High blood pressure   . Seizures ([place])     PAST SURGICAL HISTORY: Past Surgical History:  Procedure Laterality Date  . HERNIA REPAIR      FAMILY HISTORY: Family History  Problem Relation Age of Onset  . Colon cancer Father   . Diabetes Sister     SOCIAL HISTORY: Social History   Socioeconomic History  . Marital status: Married    Spouse name: [name]  . Number of children: 3  . Years of education: 77  . Highest education level: Not on file  Occupational History    Employer: [employer]  . Financial resource strain: Not on file  . Food insecurity:    Worry: Not on file    Inability: Not on file  . Transportation needs:    Medical: Not on file    Non-medical: Not on file  Tobacco Use  . Smoking status: Never Smoker  . Smokeless tobacco: Never Used  Substance and Sexual Activity  . Alcohol use: No    Alcohol/week: 0.0 oz  . Drug use: No  . Sexual activity: Not on file  Lifestyle  . Physical activity:    Days per week: Not on file    Minutes per session: Not on file  . Stress: Not on file  Relationships  .  Social connections:    Talks on phone: Not on file    Gets together: Not on file    Attends religious service: Not on file    Active member of club or organization: Not on file    Attends meetings of clubs or organizations: Not on file    Relationship status: Not on file  . Intimate partner violence:    Fear of current or ex partner: Not on file    Emotionally abused: Not on file    Physically abused: Not on file    Forced sexual activity: Not on file  Other Topics Concern  . Not on file  Social History Narrative   Patient is married and lives at  home with his wife and    Patient has three children.   Patient works as a Administrator for [employer].   Patient has a high school education.   Patient drinks caffeine- 3-4 times per week.        PHYSICAL EXAM  Vitals:   07/25/17 0823  BP: 98/63  Pulse: 64  Weight: 218 lb (98.9 kg)  Height: 6\' 1"  (1.854 m)   Body mass index is 28.76 kg/m. Generalized: Well developed, in no acute distress  Head: normocephalic and atraumatic,. Oropharynx benign Neck circumference 16 Musculoskeletal: No deformity   Neurological examination   Mentation: Alert oriented to time, place, history taking. Attention span and concentration appropriate. Recent and remote memory intact. Follows all commands speech and language fluent. ESS 6  Cranial nerve II-XII: Pupils were equal round reactive to light extraocular movements were full, visual field were full on confrontational test. Facial sensation and strength were normal. hearing was intact to finger rubbing bilaterally. Uvula tongue midline. head turning and shoulder shrug were normal and symmetric.Tongue protrusion into cheek strength was normal. Motor: normal bulk and tone, full strength in the BUE, BLE, fine finger movements normal, no pronator drift. No focal weakness Sensory: normal and symmetric to light touch, pinprick, and Vibration, in the upper and lower extremities Coordination: finger-nose-finger, heel-to-shin bilaterally, no dysmetria Reflexes: Symmetric upper and lower plantar responses were flexor bilaterally. Gait and Station: Rising up from seated position without assistance, normal stance, moderate stride, good arm swing, smooth turning, able to perform tiptoe, and heel walking without difficulty. Tandem gait is steady  DIAGNOSTIC DATA (LABS, IMAGING, TESTING) - ASSESSMENT AND PLAN 62 y.o. year old male has a past medical history of High blood pressure and complex partial Seizures, here to follow-up. Last seizure  activity 19.5 years ago.  New complaint of witnessed apneas by wife, patient works third shift.  PLAN: Will check Carbatrol level Continue Carbatrol at current dose will refill when labs back  Other labs followed by primary care Dr. Jerrin Recore Fetter Call for any seizure activity Will do sleep evaluation to assess for OSA Follow-up yearly and when necessary for seizure disorder I spent 25 min in total face to face time with the patient more than 50% of which was spent counseling and coordination of care, reviewing test results reviewing medications and discussing and reviewing the diagnosis of seizure disorder.  In addition we did discuss obstructive sleep apnea, how it is diagnosed, and the actual sleep test itself. Dennie Bible, [facility], [facility], APRN  [facility] [address] [place], [place] [address] [phone]

## 2017-07-25 ENCOUNTER — Ambulatory Visit: Payer: 59 | Admitting: Nurse Practitioner

## 2017-07-25 ENCOUNTER — Encounter: Payer: Self-pay | Admitting: Nurse Practitioner

## 2017-07-25 VITALS — BP 98/63 | HR 64 | Ht 73.0 in | Wt 218.0 lb

## 2017-07-25 DIAGNOSIS — G4730 Sleep apnea, unspecified: Secondary | ICD-10-CM | POA: Insufficient documentation

## 2017-07-25 DIAGNOSIS — Z5181 Encounter for therapeutic drug level monitoring: Secondary | ICD-10-CM | POA: Diagnosis not present

## 2017-07-25 DIAGNOSIS — G40209 Localization-related (focal) (partial) symptomatic epilepsy and epileptic syndromes with complex partial seizures, not intractable, without status epilepticus: Secondary | ICD-10-CM | POA: Diagnosis not present

## 2017-07-25 NOTE — Progress Notes (Signed)
I have read the note, and I agree with the clinical assessment and plan.  Manal Kreutzer K Leocadio Heal   

## 2017-07-25 NOTE — Patient Instructions (Signed)
Will check Carbatrol level Continue Carbatrol at current dose will refill Other labs followed by primary care Dr. Nancy Fetter Call for any seizure activity Will do sleep evaluation to assess for OSA Follow-up yearly and when necessary for seizure disorder

## 2017-07-26 ENCOUNTER — Other Ambulatory Visit: Payer: Self-pay | Admitting: Nurse Practitioner

## 2017-07-26 ENCOUNTER — Telehealth: Payer: Self-pay | Admitting: *Deleted

## 2017-07-26 LAB — CARBAMAZEPINE LEVEL, TOTAL: Carbamazepine (Tegretol), S: 11.7 ug/mL (ref 4.0–12.0)

## 2017-07-26 MED ORDER — CARBATROL 300 MG PO CP12: ORAL_CAPSULE | ORAL | 3 refills | Status: DC

## 2017-07-26 NOTE — Telephone Encounter (Signed)
Spoke with wife, [name] on [place] and informed her patient has a good level of carbamazepine. She verbalized understanding, appreciation.

## 2017-09-02 DIAGNOSIS — L081 Erythrasma: Secondary | ICD-10-CM | POA: Diagnosis not present

## 2017-09-05 DIAGNOSIS — H53432 Sector or arcuate defects, left eye: Secondary | ICD-10-CM | POA: Diagnosis not present

## 2017-09-05 DIAGNOSIS — H401122 Primary open-angle glaucoma, left eye, moderate stage: Secondary | ICD-10-CM | POA: Diagnosis not present

## 2017-09-05 DIAGNOSIS — H401111 Primary open-angle glaucoma, right eye, mild stage: Secondary | ICD-10-CM | POA: Diagnosis not present

## 2017-09-14 ENCOUNTER — Encounter: Payer: Self-pay | Admitting: Neurology

## 2017-09-14 ENCOUNTER — Ambulatory Visit: Payer: 59 | Admitting: Neurology

## 2017-09-14 VITALS — BP 108/70 | HR 59 | Ht 74.0 in | Wt 216.0 lb

## 2017-09-14 DIAGNOSIS — R0683 Snoring: Secondary | ICD-10-CM | POA: Diagnosis not present

## 2017-09-14 DIAGNOSIS — G4720 Circadian rhythm sleep disorder, unspecified type: Secondary | ICD-10-CM | POA: Diagnosis not present

## 2017-09-14 DIAGNOSIS — R0689 Other abnormalities of breathing: Secondary | ICD-10-CM

## 2017-09-14 NOTE — Progress Notes (Signed)
SLEEP MEDICINE CLINIC   Provider:  Larey Seat, M D  Primary Care Physician:  Donald Prose, MD   Referring Provider: Jannifer Franklin, MD   Chief Complaint  Patient presents with  . New Patient (Initial Visit)    pt alone, rm 10. pt states that his wife has told him that he snores in sleep and the pt states he has woke up feeling like he has to catch his breath. pt works night shift.     HPI:  Darryl Fletcher is a 62 y.o. male , seen here as a referral from [place] and Cecille Rubin, [facility].  Chief complaint according to patient :" I think I am sleepy because of night shift work, 5 nights of 8 hours."  Darryl Fletcher reports that when he is at rest and relaxed he has no trouble going to sleep even if he did not intend to, however he denies that he has to find an irresistible urge to go to sleep.  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  He does report decreased energy and he has a history of seizures and hypertension both well controlled with medications he is currently using [place] and he used in the past to be treated with Dilantin for his seizure disorder.   He has not had any breakthrough activity in years..    Sleep habits are as follows: night shift work , 5 nights 8 hours ( 9 PM -5 AM ), returns home at 6 Am , has a cup of coffee and than goes to bed.  He rises at noon, after 5 hours of sleep. On Monday and Tuesday he has the heaviest work load and he needs to sleep longer , sometimes manages to sleep until 3 PM. Usually the patient falls asleep on his side but must resume a supine position during the night, and this is when he snores or loudest.  His wife has reported this. He sleeps with 2 pillows, he recalls dreaming, and is getting  up twice for the toilet, on average. Wife reported he quit breathing , sometimes he wakes with a choking sensation.  No GERD. Rises and feels refreshed. Sleep habits on weekends are opposite , he reverts back to night time sleep. Going out, watching TV- Saturday night bedtime is 10 Pm and rise time on Sunday is 7 AM. He naps on Sunday , but usually less than 50 minutes.   Sleep medical history and family sleep history: 2 sisters , alive none with apnea,   Social history:  Wife , 2 children ( 70 and 25) , non smoker, non drinker, caffeine coffee before bedtime. 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   Follow-up for epilepsy, complex partial seizure disorder new complaint of witnessed apnea by wife, patient works third shift  HISTORY FROM: Patient. UPDATE 07/25/2017 CM.Darryl Fletcher, 62 year old black male returns for yearly followup. He has a history of complex partial seizure disorder currently well controlled on Carbatrol brand and no seizure activity in 19.5 years. His labs are drawn by his primary care doctor , Dr. Nancy Fetter, and he claims they were within normal limits at last check. He denies any balance issues, any  staring spells, any episodes of confusion,  He denies any side effects to the Carbatrol. He continues to work out 3 times a week.  He has no new complaints of witnessed apneas by his wife, and he is wanting to have a sleep study.  He also works third shift.  He denies any morning headaches, he returns for reevaluation. He had previously been on Dilantin but had difficulty with his gums.   Review of Systems: Out of a complete 14 system review, the patient complains of only the following symptoms, and all other reviewed systems are negative. Snoring, apnea,   Epworth score  11 , Fatigue severity score 23  , depression score 1/ 15    Social History   Socioeconomic History  . Marital status: Married    Spouse name: [name]  . Number of  children: 3  . Years of education: 29  . Highest education level: Not on file  Occupational History    Employer: [employer]  . Financial resource strain: Not on file  . Food insecurity:    Worry: Not on file    Inability: Not on file  . Transportation needs:    Medical: Not on file    Non-medical: Not on file  Tobacco Use  . Smoking status: Never Smoker  . Smokeless tobacco: Never Used  Substance and Sexual Activity  . Alcohol use: No    Alcohol/week: 0.0 oz  . Drug use: No  . Sexual activity: Not on file  Lifestyle  . Physical activity:    Days per week: Not on file    Minutes per session: Not on file  . Stress: Not on file  Relationships  . Social connections:    Talks on phone: Not on file    Gets together: Not on file    Attends religious service: Not on file    Active member of club or organization: Not on file    Attends meetings of clubs or organizations: Not on file    Relationship status: Not on file  . Intimate partner violence:    Fear of current or ex partner: Not on file    Emotionally abused: Not on file    Physically abused: Not on file    Forced sexual activity: Not on file  Other Topics Concern  . Not on file  Social History Narrative   Patient is married and lives at home with his wife and    Patient has three children.   Patient works as a Administrator for [employer].   Patient has a high school education.   Patient drinks caffeine- 3-4 times per week.       Family History  Problem Relation Age of Onset  . Colon cancer Father   . Diabetes Sister     Past Medical History:  Diagnosis Date  . High blood pressure   . Seizures ([place])     Past Surgical History:  Procedure Laterality Date  . HERNIA REPAIR      Current Outpatient Medications  Medication Sig Dispense Refill  . amLODipine-benazepril (LOTREL) 10-20 MG per capsule 1 capsule daily.    Marland Kitchen CARBATROL 300 MG 12 hr capsule TAKE 1 CAPSULE BY MOUTH TWO TIMES  DAILY 180 capsule 3  . COMBIGAN 0.2-0.5 % ophthalmic solution     . dorzolamide-timolol (COSOPT) 22.3-6.8 MG/ML ophthalmic solution 1 drop 2 (two) times daily.    . hydrochlorothiazide (HYDRODIURIL) 25 MG tablet Take 25 mg by mouth  daily.    . Travoprost, BAK Free, (TRAVATAN) 0.004 % SOLN ophthalmic solution 1 drop at bedtime.     No current facility-administered medications for this visit.     Allergies as of 09/14/2017  . (No Known Allergies)    Vitals: BP 108/70   Pulse (!) 59   Ht 6\' 2"  (1.88 m)   Wt 216 lb (98 kg)   BMI 27.73 kg/m  Last Weight:  Wt Readings from Last 1 Encounters:  09/14/17 216 lb (98 kg)   ZOX:WRUE mass index is 27.73 kg/m.     Last Height:   Ht Readings from Last 1 Encounters:  09/14/17 6\' 2"  (1.88 m)    Physical exam:  General: The patient is awake, alert and appears not in acute distress. The patient is well groomed. Head: Normocephalic, atraumatic. Neck is supple. Mallampati 4. ,  neck circumference:17". Nasal airflow patent , Retrognathia is mild.  Cardiovascular:  Regular rate and rhythm , without  murmurs or carotid bruit, and without distended neck veins. Respiratory: Lungs are clear to auscultation. Skin:  Without evidence of edema, or rash Trunk: BMI is 27. The patient's posture is erect.   Neurologic exam : The patient is awake and alert, oriented to place and time.    Attention span & concentration ability appears normal.  Speech is fluent,  without  dysarthria, dysphonia or aphasia.  Mood and affect are appropriate.  Cranial nerves: Pupils are equal and briskly reactive to light. Funduscopic exam without evidence of pallor or edema. Extraocular movements  in vertical and horizontal planes intact and without nystagmus. Visual fields by finger perimetry are intact. Hearing to finger rub intact.  Facial sensation intact to fine touch. Facial motor strength is symmetric and tongue and uvula move midline. Shoulder shrug was symmetrical.    Motor exam: Normal tone, muscle bulk and symmetric strength in all extremities. Sensory:  Fine touch, pinprick and vibration were tested in all extremities. Proprioception tested in the upper extremities was normal. Coordination: Rapid alternating movements in the fingers/hands was normal. Finger-to-nose maneuver  normal without evidence of ataxia, dysmetria or tremor. Gait and station: Patient walks without assistive device and is able unassisted to climb up to the exam table. Strength within normal limits.  Stance is stable and normal. Turns with 3 Steps.  Deep tendon reflexes: in the  upper and lower extremities are symmetric and intact. Babinski maneuver response is downgoing.   Assessment:  After physical and neurologic examination, review of laboratory studies,  Personal review of imaging studies, reports of other /same  Imaging studies, results of polysomnography and / or neurophysiology testing and pre-existing records as far as provided in visit., my assessment is   1) Circadian rhythm disorder, shift work type  2) snoring and sleep choking, likely reflecting sleep apnea.   3) remote history of seizures ( 20 Years )  affecting the right body and breathing rhythm.    The patient was advised of the nature of the diagnosed disorder , the treatment options and the  risks for general health and wellness arising from not treating the condition.   I spent more than 40 minutes of face to face time with the patient.  Greater than 50% of time was spent in counseling and coordination of care. We have discussed the diagnosis and differential and I answered the patient's questions.    Plan:  Treatment plan and additional workup :  Daytime sleepiness is related to night time work and reduced sleep hours  in daytime, as well as arousals from choking, snoring. possible OSA.  HST for daytime use ordered.Rv after HST if positive.    Larey Seat, MD 0/[id number]/9574, 7:34 PM  Certified in Neurology  by ABPN Certified in [place] by [facility] [address] [place], [place] [address]

## 2017-10-05 DIAGNOSIS — I10 Essential (primary) hypertension: Secondary | ICD-10-CM | POA: Diagnosis not present

## 2017-10-05 DIAGNOSIS — T6291XA Toxic effect of unspecified noxious substance eaten as food, accidental (unintentional), initial encounter: Secondary | ICD-10-CM | POA: Diagnosis not present

## 2017-10-17 ENCOUNTER — Ambulatory Visit (INDEPENDENT_AMBULATORY_CARE_PROVIDER_SITE_OTHER): Payer: 59 | Admitting: Neurology

## 2017-10-17 DIAGNOSIS — G4733 Obstructive sleep apnea (adult) (pediatric): Secondary | ICD-10-CM

## 2017-10-17 DIAGNOSIS — R0689 Other abnormalities of breathing: Secondary | ICD-10-CM

## 2017-10-17 DIAGNOSIS — G4720 Circadian rhythm sleep disorder, unspecified type: Secondary | ICD-10-CM

## 2017-10-17 DIAGNOSIS — R0683 Snoring: Secondary | ICD-10-CM

## 2017-11-07 NOTE — Procedures (Signed)
[facility] [place], [place] [address] NAME:  Darryl Fletcher                                                                DOB: [birth year] MEDICAL RECORD NUMBER [id number]                                              DOS:  10-18-2017 REFERRING PHYSICIAN: Jill Alexanders, MD/ Evlyn Courier, NP STUDY PERFORMED: Home Sleep Study on apnea link HISTORY: Darryl Fletcher is a 62 y.o. male patient of Dr.Willis' and Cecille Rubin, [facility].   Chief complaint according to patient:" I think I am sleepy because of night shift work, 5 nights of 8 hours." [name] reports that when he is at rest and relaxed he has no trouble going to sleep- even if he did not intend to. However, he denies that he has an irresistible urge to go to sleep.  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  He does report decreased energy and he has a history of seizures and hypertension, both well controlled with medications. He has not had any breakthrough activity in years. Epworth Sleepiness score endorsed at 11 points, Fatigue severity score at 23/63 points, BMI: 27.7  STUDY RESULTS:  Total Recording Time: 6 hours 38 minutes, valid test time 5 h and 45 min.  Total Apnea/Hypopnea Index (AHI): 11.3 /h; RDI: 15.3/h Average Oxygen Saturation: 92 %; Lowest Oxygen Desaturation: 83 %  Total Time Oxygen Saturation Below or at 88 %: 2.0 minutes  Average Heart Rate:  66 bpm (between 56 and 98 bpm) IMPRESSION: Mild Obstructive Sleep Apnea with loud snoring.  No evidence of cardiac stress being induced, no evidence of hypoxemia.  RECOMMENDATION:  The patient has the option of using CPAP or a dental device. I will offer a referral to dentistry or to auto CPAP depending on his preference. I certify that I have  reviewed the raw data recording prior to the issuance of this report in accordance with the standards of the American Academy of Sleep Medicine (AASM). Larey Seat, M.D.   11-07-2017      Medical Director of [facility] at [facility], accredited by the AASM. Diplomat of the ABPN and ABSM. Fellow of the AASM and AAN.

## 2017-11-08 ENCOUNTER — Telehealth: Payer: Self-pay | Admitting: Neurology

## 2017-11-08 NOTE — Telephone Encounter (Signed)
Pt returning RN's call.

## 2017-11-08 NOTE — Telephone Encounter (Signed)
-----   Message from Larey Seat, MD sent at 11/07/2017  5:20 PM EDT ----- IMPRESSION: Mild Obstructive Sleep Apnea with loud snoring.  No evidence of cardiac stress being induced, no evidence of  hypoxemia.  RECOMMENDATION: The patient has the option of using CPAP or a  dental device. I will offer a referral to dentistry or to auto  CPAP depending on his preference.

## 2017-11-08 NOTE — Telephone Encounter (Signed)
Called patient to discuss sleep study results. No answer at this time. LVM for the patient to call back.   

## 2017-11-08 NOTE — Telephone Encounter (Signed)
Called the patient back and reviewed the sleep study with him. I went over both options that were discussed and informed him of what took place with both. Pt decided to try the dental device at this time. He would like to check with his dentist and see if they offer this treatment there and then if not he will call back and get a referral from [place]. Pt verbalized understanding.   If patient calls back and says he can use his dentist please get the dentist name and phone number and we can send the information to their office to get him set up through them otherwise if they dont please let the patient know that we can place a referral and someone from the dentist office will contact him to get him scheduled.

## 2017-11-10 ENCOUNTER — Other Ambulatory Visit: Payer: Self-pay | Admitting: Neurology

## 2017-11-10 DIAGNOSIS — G4730 Sleep apnea, unspecified: Secondary | ICD-10-CM

## 2017-11-10 NOTE — Telephone Encounter (Signed)
I called the patient back and made him aware that I can get those orders sent over for him today for CPAP. I reviewed PAP compliance expectations with the pt. Pt is agreeable to starting a CPAP. I advised pt that an order will be sent to a DME, [facility], and [facility] will call the pt within about one week after they file with the pt's insurance. [facility] will show the pt how to use the machine, fit for masks, and troubleshoot the CPAP if needed. A follow up appt was made for insurance purposes with Dr. Brett Fairy on Oct 16,2019 at 11:00 am. Pt verbalized understanding to arrive 15 minutes early and bring their CPAP. A letter with all of this information in it will be mailed to the pt as a reminder. I verified with the pt that the address we have on file is correct. Pt verbalized understanding of results. Pt had no questions at this time but was encouraged to call back if questions arise.

## 2017-11-10 NOTE — Telephone Encounter (Signed)
Pt called back and asked to leave a message for Rn that he is wanting to go with the CPAP.

## 2017-11-29 DIAGNOSIS — G4733 Obstructive sleep apnea (adult) (pediatric): Secondary | ICD-10-CM | POA: Diagnosis not present

## 2017-12-12 ENCOUNTER — Ambulatory Visit (INDEPENDENT_AMBULATORY_CARE_PROVIDER_SITE_OTHER): Payer: 59 | Admitting: Orthopedic Surgery

## 2017-12-12 ENCOUNTER — Encounter (INDEPENDENT_AMBULATORY_CARE_PROVIDER_SITE_OTHER): Payer: Self-pay | Admitting: Orthopedic Surgery

## 2017-12-12 DIAGNOSIS — M722 Plantar fascial fibromatosis: Secondary | ICD-10-CM

## 2017-12-12 MED ORDER — LIDOCAINE HCL 1 % IJ SOLN
2.0000 mL | INTRAMUSCULAR | Status: AC | PRN
  Administered 2017-12-12: 2 mL

## 2017-12-12 MED ORDER — METHYLPREDNISOLONE ACETATE 40 MG/ML IJ SUSP
40.0000 mg | INTRAMUSCULAR | Status: AC | PRN
Start: 2017-12-12 — End: 2017-12-12
  Administered 2017-12-12: 40 mg

## 2017-12-12 MED ORDER — IBUPROFEN 800 MG PO TABS: 800.0000 mg | ORAL_TABLET | Freq: Three times a day (TID) | ORAL | 3 refills | Status: DC | PRN

## 2017-12-12 NOTE — Progress Notes (Signed)
Office Visit Note   Patient: Darryl Fletcher           Date of Birth: 14-Feb-1956           MRN: [id number] Visit Date: 12/12/2017              Requested by: Donald Prose, MD [address] [place], [place] [id number] PCP: Donald Prose, MD  Chief Complaint  Patient presents with  . Left Foot - Pain      HPI: Patient is a 62 year old gentleman who presents complaining of plantar fasciitis on the left.  Patient states that he is tried some orthotics with cushion shoe wear without relief.  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  Patient is used ibuprofen in the past with good relief.  Assessment & Plan: Visit Diagnoses:  1. Plantar fasciitis     Plan: A prescription for ibuprofen was called and patient underwent injection for the plantar fascia recommended a stiff soled Trail running sneaker with over-the-counter orthotics.  Follow-Up Instructions: Return if symptoms worsen or fail to improve.   Ortho Exam  Patient is alert, oriented, no adenopathy, well-dressed, normal affect, normal respiratory effort. Examination patient has good pulses he has good dorsiflexion of the ankle good subtalar motion.  Patient has no tenderness to palpation over the tarsal tunnel he is tender to palpation over the origin the plantar fascia and this reproduces his symptoms.  Imaging: No results found. No images are attached to the encounter.  Labs: No results found for: HGBA1C, ESRSEDRATE, CRP, LABURIC, REPTSTATUS, GRAMSTAIN, CULT, LABORGA   No results found for: ALBUMIN, PREALBUMIN, LABURIC  There is no height or weight on file to calculate BMI.  Orders:  No orders of the defined types were placed in this encounter.  Meds ordered this encounter  Medications  . ibuprofen (ADVIL,MOTRIN) 800 MG tablet    Sig: Take 1 tablet (800 mg total) by mouth every 8 (eight) hours as needed.    Dispense:  60 tablet    Refill:  3     Procedures: Foot Inj Date/Time: 12/12/2017 1:58 PM Performed by: Newt Minion, MD Authorized by: Newt Minion, MD   Consent Given by:  Patient Site marked: the procedure site was marked   Timeout: prior to procedure the correct patient, procedure, and site was verified   Indications:  Fasciitis and pain Condition: Plantar Fasciitis   Location: left plantar fascia muscle   Prep: patient was prepped and draped in usual sterile fashion   Needle Size:  22 G Approach:  Lateral Medications:  2 mL lidocaine 1 %; 40 mg methylPREDNISolone acetate 40 MG/ML Patient Tolerance:  Patient tolerated the procedure well with no immediate complications    Clinical Data: No additional findings.  ROS:  All other systems negative, except as noted in the HPI. Review of Systems  Objective: Vital Signs: There were no vitals taken for this visit.  Specialty Comments:  No specialty comments available.  PMFS History: Patient Active Problem List   Diagnosis Date Noted  . Therapeutic drug monitoring 07/25/2017  . Apnea, sleep 07/25/2017  . Partial epilepsy with impairment of consciousness ([place]) 04/09/2013   Past Medical History:  Diagnosis Date  . High blood pressure   . Seizures ([place])     Family History  Problem Relation Age of Onset  . Colon cancer Father   . Diabetes Sister     Past  Surgical History:  Procedure Laterality Date  . HERNIA REPAIR     Social History   Occupational History    Employer: [employer]  Tobacco Use  . Smoking status: Never Smoker  . Smokeless tobacco: Never Used  Substance and Sexual Activity  . Alcohol use: No    Alcohol/week: 0.0 standard drinks  . Drug use: No  . Sexual activity: Not on file

## 2017-12-13 DIAGNOSIS — R5383 Other fatigue: Secondary | ICD-10-CM | POA: Diagnosis not present

## 2017-12-13 DIAGNOSIS — R21 Rash and other nonspecific skin eruption: Secondary | ICD-10-CM | POA: Diagnosis not present

## 2017-12-13 DIAGNOSIS — Z125 Encounter for screening for malignant neoplasm of prostate: Secondary | ICD-10-CM | POA: Diagnosis not present

## 2017-12-13 DIAGNOSIS — I10 Essential (primary) hypertension: Secondary | ICD-10-CM | POA: Diagnosis not present

## 2017-12-13 DIAGNOSIS — E7800 Pure hypercholesterolemia, unspecified: Secondary | ICD-10-CM | POA: Diagnosis not present

## 2017-12-13 DIAGNOSIS — Z0000 Encounter for general adult medical examination without abnormal findings: Secondary | ICD-10-CM | POA: Diagnosis not present

## 2017-12-22 DIAGNOSIS — S6010XA Contusion of unspecified finger with damage to nail, initial encounter: Secondary | ICD-10-CM | POA: Diagnosis not present

## 2017-12-22 DIAGNOSIS — Z23 Encounter for immunization: Secondary | ICD-10-CM | POA: Diagnosis not present

## 2017-12-30 DIAGNOSIS — G4733 Obstructive sleep apnea (adult) (pediatric): Secondary | ICD-10-CM | POA: Diagnosis not present

## 2018-01-02 DIAGNOSIS — H401111 Primary open-angle glaucoma, right eye, mild stage: Secondary | ICD-10-CM | POA: Diagnosis not present

## 2018-01-02 DIAGNOSIS — H33311 Horseshoe tear of retina without detachment, right eye: Secondary | ICD-10-CM | POA: Diagnosis not present

## 2018-01-02 DIAGNOSIS — H401122 Primary open-angle glaucoma, left eye, moderate stage: Secondary | ICD-10-CM | POA: Diagnosis not present

## 2018-01-04 DIAGNOSIS — G4733 Obstructive sleep apnea (adult) (pediatric): Secondary | ICD-10-CM | POA: Diagnosis not present

## 2018-01-16 DIAGNOSIS — R972 Elevated prostate specific antigen [PSA]: Secondary | ICD-10-CM | POA: Diagnosis not present

## 2018-01-29 DIAGNOSIS — G4733 Obstructive sleep apnea (adult) (pediatric): Secondary | ICD-10-CM | POA: Diagnosis not present

## 2018-02-06 ENCOUNTER — Encounter: Payer: Self-pay | Admitting: Neurology

## 2018-02-08 ENCOUNTER — Ambulatory Visit: Payer: 59 | Admitting: Neurology

## 2018-02-08 ENCOUNTER — Encounter: Payer: Self-pay | Admitting: Neurology

## 2018-02-08 VITALS — BP 122/82 | HR 64 | Ht 74.0 in | Wt 210.0 lb

## 2018-02-08 DIAGNOSIS — G4733 Obstructive sleep apnea (adult) (pediatric): Secondary | ICD-10-CM | POA: Diagnosis not present

## 2018-02-08 DIAGNOSIS — R0683 Snoring: Secondary | ICD-10-CM | POA: Diagnosis not present

## 2018-02-08 DIAGNOSIS — G4720 Circadian rhythm sleep disorder, unspecified type: Secondary | ICD-10-CM

## 2018-02-08 DIAGNOSIS — R0689 Other abnormalities of breathing: Secondary | ICD-10-CM | POA: Insufficient documentation

## 2018-02-08 DIAGNOSIS — Z9989 Dependence on other enabling machines and devices: Secondary | ICD-10-CM

## 2018-02-08 NOTE — Progress Notes (Signed)
SLEEP MEDICINE CLINIC   Provider:  Larey Seat, M D  Primary Care Physician:  Donald Prose, MD   Referring Provider:  Lenor Coffin, MD/ Cecille Rubin, NP    Chief Complaint  Patient presents with  . Follow-up    pt alone, rm 11. pt here for intial CPAP follow up. pt states that he works night shift and the hours that it records doesnt show and allow for it to calcuate correctly to get over 4 hrs. he states he typically sleeps from 9 am to 1 pm and the time starts over at 12. DME [employer]. he states that when he is able to wear it longer he does feel better.     HPI:  Darryl Fletcher is a 62 y.o. male patient and shift worker returns after a PSG and starting CPAP therapy: ( Dr.Willis and Cecille Rubin, [facility]).  Interval history from 08 February 2018.  I have the pleasure of seeing Mr. Darryl Case Ms. today, a 62 year old African-American right-handed gentleman who was diagnosed with a home sleep study on apnea link with obstructive sleep apnea.  Date of study was 18 October 2017.  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  He had endorsed a reduced ability to sleep more than 4 5 hours in daytime.  The study showed that he has mild obstructive sleep apnea at an AHI of 11.3, he did have no significant and prolonged oxygen desaturation.  And snoring was rated as loud.  The patient has the option of using CPAP or dental device but we first started on in autotitrator.  He has been using the machine 28 out of 30 days but only 15 days was he able to achieve more than 4 hours of therapy.  Average use of time on total days is 3 hours and 53 minutes.  His AutoSet is set between 5 and 12 cmH2O with 3 cm EPR and his AHI the residual apnea index is 1.3/h.  There are some air leakages but his apnea is very well controlled and more than 90% reduced in comparison to baseline 95th percentile pressure was 8.9 and is well within the current settings.  He feels a difference in  alertness, depending how many hours he works and how many he can use CPAP. 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  He will make an attempt to sleep more than 5 hours with CPAP in daytime. He is on vacation this week.    Chief complaint according to patient :" I think I am sleepy because of night shift work, 5 nights of 8 hours."  [name] reports that when he is at rest and relaxed he has no trouble going to sleep even if he did not intend to, however he denies that he has to find an irresistible urge to go to sleep.  He has no trouble staying alert at work, while driving, so he endorsed the Epworth sleepiness score at 11 points, with the highest endorsement of 3 points for lying down to rest in the afternoon which he does as a nocturnal worker, and sleepiness while sitting in active and an open [place] place.  He does report decreased energy and he has a history of seizures and hypertension both well controlled with medications he is currently using capital and he used in the past to be treated with Dilantin for his seizure disorder.  He has not had  any breakthrough activity in years..  Sleep habits are as follows: night shift work , 5 nights 8 hours ( 9 PM -5 AM ), returns home at 6 Am , has a cup of coffee and than goes to bed.  He rises at noon, after 5 hours of sleep. On Monday and Tuesday he has the heaviest work load and he needs to sleep longer , sometimes manages to sleep until 3 PM. Usually the patient falls asleep on his side but must resume a supine position during the night, and this is when he snores or loudest.  His wife has reported this. He sleeps with 2 pillows, he recalls dreaming, and is getting up twice for the toilet, on average. Wife reported he quit breathing , sometimes he wakes with a choking sensation.  No GERD. Rises and feels refreshed. Sleep habits on weekends are opposite , he reverts back to night time sleep. Going out, watching  TV- Saturday night bedtime is 10 Pm and rise time on Sunday is 7 AM. He naps on Sunday , but usually less than 50 minutes.   Sleep medical history and family sleep history: 2 sisters , alive none with apnea,   Social history:  Wife , 2 children ( 48 and 35) , non smoker, non drinker, caffeine coffee before bedtime. 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   Follow-up for epilepsy, complex partial seizure disorder new complaint of witnessed apnea by wife, patient works third shift  HISTORY FROM: Patient. UPDATE 07/25/2017 CM.Mr. [name], 63 year old black male returns for yearly followup. He has a history of complex partial seizure disorder currently well controlled on Carbatrol brand and no seizure activity in 19.5 years. His labs are drawn by his primary care doctor , Dr. Nancy Fetter, and he claims they were within normal limits at last check. He denies any balance issues, any staring spells, any episodes of confusion,  He denies any side effects to the Carbatrol. He continues to work out 3 times a week.  He has no new complaints of witnessed apneas by his wife, and he is wanting to have a sleep study.  He also works third shift.  He denies any morning headaches, he returns for reevaluation. He had previously been on Dilantin but had difficulty with his gums.   Review of Systems: Out of a complete 14 system review, the patient complains of only the following symptoms, and all other reviewed systems are negative. Snoring, apnea, no seizure activity.  Epworth score  11 and on CPAP 8 points , Fatigue severity score 23 and on CPAP 29/63   , depression score 1/ 15 .   Social History   Socioeconomic History  . Marital status: Married    Spouse name: [name]  . Number of children: 3  . Years of education: 68  . Highest education level: Not on file  Occupational History    Employer: [employer]  . Financial resource strain: Not on file  .  Food insecurity:    Worry: Not on file    Inability: Not on file  . Transportation needs:    Medical: Not on file    Non-medical: Not on file  Tobacco Use  . Smoking status: Never Smoker  . Smokeless tobacco: Never Used  Substance and Sexual Activity  . Alcohol use: No    Alcohol/week: 0.0 standard drinks  . Drug use: No  . Sexual activity: Not on  file  Lifestyle  . Physical activity:    Days per week: Not on file    Minutes per session: Not on file  . Stress: Not on file  Relationships  . Social connections:    Talks on phone: Not on file    Gets together: Not on file    Attends religious service: Not on file    Active member of club or organization: Not on file    Attends meetings of clubs or organizations: Not on file    Relationship status: Not on file  . Intimate partner violence:    Fear of current or ex partner: Not on file    Emotionally abused: Not on file    Physically abused: Not on file    Forced sexual activity: Not on file  Other Topics Concern  . Not on file  Social History Narrative   Patient is married and lives at home with his wife and    Patient has three children.   Patient works as a Administrator for [employer].   Patient has a high school education.   Patient drinks caffeine- 3-4 times per week.       Family History  Problem Relation Age of Onset  . Colon cancer Father   . Diabetes Sister     Past Medical History:  Diagnosis Date  . High blood pressure   . Seizures ([place])     Past Surgical History:  Procedure Laterality Date  . HERNIA REPAIR      Current Outpatient Medications  Medication Sig Dispense Refill  . amLODipine-benazepril (LOTREL) 10-20 MG per capsule 1 capsule daily.    Marland Kitchen CARBATROL 300 MG 12 hr capsule TAKE 1 CAPSULE BY MOUTH TWO TIMES DAILY 180 capsule 3  . COMBIGAN 0.2-0.5 % ophthalmic solution     . dorzolamide-timolol (COSOPT) 22.3-6.8 MG/ML ophthalmic solution 1 drop 2 (two) times daily.    .  hydrochlorothiazide (HYDRODIURIL) 25 MG tablet Take 25 mg by mouth daily.    Marland Kitchen ibuprofen (ADVIL,MOTRIN) 800 MG tablet Take 1 tablet (800 mg total) by mouth every 8 (eight) hours as needed. 60 tablet 3  . Travoprost, BAK Free, (TRAVATAN) 0.004 % SOLN ophthalmic solution 1 drop at bedtime.     No current facility-administered medications for this visit.     Allergies as of 02/08/2018  . (No Known Allergies)    Vitals: BP 122/82   Pulse 64   Ht 6\' 2"  (1.88 m)   Wt 210 lb (95.3 kg)   SpO2 99%   BMI 26.96 kg/m  Last Weight:  Wt Readings from Last 1 Encounters:  02/08/18 210 lb (95.3 kg)   JXB:JYNW mass index is 26.96 kg/m.     Last Height:   Ht Readings from Last 1 Encounters:  02/08/18 6\' 2"  (1.88 m)    Physical exam:  General: The patient is awake, alert and appears not in acute distress. The patient is well groomed. Head: Normocephalic, atraumatic. Neck is supple. Mallampati 4. ,  neck circumference:17". Nasal airflow patent , Retrognathia is mild.  Cardiovascular:  Regular rate and rhythm  Respiratory: Lungs are clear to auscultation. Skin:  Without evidence of edema, or rash Trunk: BMI is 27. The patient's posture is erect.   Neurologic exam : The patient is awake and alert, oriented to place and time.    Attention span & concentration ability appears normal.   Cranial nerves: No changes in taste or smell. Pupils are equal and briskly reactive  to light. Hearing to finger rub intact.  Facial sensation intact to fine touch. Facial motor strength is symmetric and tongue and uvula move midline. Shoulder shrug was symmetrical.  Motor exam: Normal tone, muscle bulk and symmetric strength in all extremities. Sensory:  Fine touch, pinprick and vibration were tested in all extremities. Proprioception tested in the upper extremities was normal. Coordination: Rapid alternating movements in the fingers/hands was normal. Finger-to-nose maneuver  normal without evidence of ataxia,  dysmetria or tremor. Gait and station: Patient walks without assistive device and is able unassisted to climb up to the exam table. Strength within normal limits.  Stance is stable and normal. Turns with 3 Steps.  Deep tendon reflexes: in the  upper and lower extremities are symmetric and intact. Babinski maneuver response is downgoing.   Assessment:  After physical and neurologic examination, review of laboratory studies,  Personal review of imaging studies, reports of other /same  Imaging studies, results of polysomnography and / or neurophysiology testing and pre-existing records as far as provided in visit., my assessment is   1) Circadian rhythm disorder, shift work type. He has limited daytime hours to sleep.   2) mild obsructive sleep apnea on auto CPAP- no changes to be made. .      The patient was advised of the nature of the diagnosed disorder , the treatment options and the  risks for general health and wellness arising from not treating the condition.   I spent more than 15 minutes of face to face time with the patient.  Greater than 50% of time was spent in counseling and coordination of care. We have discussed the diagnosis and differential and I answered the patient's questions.    Plan:  Treatment plan and additional workup :  Daytime sleepiness is improved under CPAP use, patient is advised to increase daily use by 45 minutes.  He goes to sleep at 8.30 AM and rises at 12- 13 hours, giving him only 4 hours of sleep per calendar day.   I will ask [facility] if we can set the date and daily sleep recording from 6 am  to 6 pm.  Larey Seat, MD 19/50/9326, 71:24 AM  Certified in Neurology by ABPN Certified in [place] by [facility] [address], [place], [place] [address]

## 2018-02-08 NOTE — Patient Instructions (Signed)

## 2018-02-13 DIAGNOSIS — Z800 Family history of malignant neoplasm of digestive organs: Secondary | ICD-10-CM | POA: Diagnosis not present

## 2018-02-13 DIAGNOSIS — Z86010 Personal history of colonic polyps: Secondary | ICD-10-CM | POA: Diagnosis not present

## 2018-02-22 DIAGNOSIS — N400 Enlarged prostate without lower urinary tract symptoms: Secondary | ICD-10-CM | POA: Diagnosis not present

## 2018-02-22 DIAGNOSIS — R972 Elevated prostate specific antigen [PSA]: Secondary | ICD-10-CM | POA: Diagnosis not present

## 2018-02-27 DIAGNOSIS — N400 Enlarged prostate without lower urinary tract symptoms: Secondary | ICD-10-CM | POA: Diagnosis not present

## 2018-03-01 DIAGNOSIS — G4733 Obstructive sleep apnea (adult) (pediatric): Secondary | ICD-10-CM | POA: Diagnosis not present

## 2018-03-03 ENCOUNTER — Encounter (HOSPITAL_COMMUNITY): Payer: Self-pay | Admitting: Emergency Medicine

## 2018-03-03 ENCOUNTER — Emergency Department (HOSPITAL_COMMUNITY)
Admission: EM | Admit: 2018-03-03 | Discharge: 2018-03-03 | Disposition: A | Discharge status: Home / Self Care | Payer: 59 | Attending: Emergency Medicine | Admitting: Emergency Medicine

## 2018-03-03 DIAGNOSIS — R55 Syncope and collapse: Secondary | ICD-10-CM | POA: Diagnosis not present

## 2018-03-03 DIAGNOSIS — G4733 Obstructive sleep apnea (adult) (pediatric): Secondary | ICD-10-CM | POA: Diagnosis not present

## 2018-03-03 DIAGNOSIS — R110 Nausea: Secondary | ICD-10-CM | POA: Diagnosis not present

## 2018-03-03 DIAGNOSIS — R1111 Vomiting without nausea: Secondary | ICD-10-CM | POA: Diagnosis not present

## 2018-03-03 DIAGNOSIS — I959 Hypotension, unspecified: Secondary | ICD-10-CM | POA: Diagnosis not present

## 2018-03-03 DIAGNOSIS — E876 Hypokalemia: Secondary | ICD-10-CM

## 2018-03-03 DIAGNOSIS — Z79899 Other long term (current) drug therapy: Secondary | ICD-10-CM | POA: Diagnosis not present

## 2018-03-03 LAB — CBC WITH DIFFERENTIAL/PLATELET
Abs Immature Granulocytes: 0.06 10*3/uL (ref 0.00–0.07)
BASOS ABS: 0 10*3/uL (ref 0.0–0.1)
Basophils Relative: 0 %
EOS PCT: 1 %
Eosinophils Absolute: 0.1 10*3/uL (ref 0.0–0.5)
HCT: 40.2 % (ref 39.0–52.0)
Hemoglobin: 13.2 g/dL (ref 13.0–17.0)
Immature Granulocytes: 1 %
LYMPHS PCT: 13 %
Lymphs Abs: 1.4 10*3/uL (ref 0.7–4.0)
MCH: 30.8 pg (ref 26.0–34.0)
MCHC: 32.8 g/dL (ref 30.0–36.0)
MCV: 93.9 fL (ref 80.0–100.0)
Monocytes Absolute: 0.9 10*3/uL (ref 0.1–1.0)
Monocytes Relative: 8 %
NRBC: 0 % (ref 0.0–0.2)
Neutro Abs: 8.6 10*3/uL — ABNORMAL HIGH (ref 1.7–7.7)
Neutrophils Relative %: 77 %
PLATELETS: 189 10*3/uL (ref 150–400)
RBC: 4.28 MIL/uL (ref 4.22–5.81)
RDW: 13.4 % (ref 11.5–15.5)
WBC: 11 10*3/uL — AB (ref 4.0–10.5)

## 2018-03-03 LAB — COMPREHENSIVE METABOLIC PANEL WITH GFR
ALT: 20 U/L (ref 0–44)
AST: 22 U/L (ref 15–41)
Albumin: 4 g/dL (ref 3.5–5.0)
Alkaline Phosphatase: 59 U/L (ref 38–126)
Anion gap: 10 (ref 5–15)
BUN: 10 mg/dL (ref 6–20)
CO2: 25 mmol/L (ref 22–32)
Calcium: 8.6 mg/dL — ABNORMAL LOW (ref 8.9–10.3)
Chloride: 101 mmol/L (ref 98–111)
Creatinine, Ser: 1.3 mg/dL — ABNORMAL HIGH (ref 0.61–1.24)
GFR calc Af Amer: >60 mL/min
GFR calc non Af Amer: 58 mL/min — ABNORMAL LOW
Glucose, Bld: 152 mg/dL — ABNORMAL HIGH (ref 70–99)
Potassium: 2.9 mmol/L — ABNORMAL LOW (ref 3.5–5.1)
Sodium: 136 mmol/L (ref 135–145)
Total Bilirubin: 0.6 mg/dL (ref 0.3–1.2)
Total Protein: 7.2 g/dL (ref 6.5–8.1)

## 2018-03-03 LAB — CBG MONITORING, ED: GLUCOSE-CAPILLARY: 126 mg/dL — AB (ref 70–99)

## 2018-03-03 LAB — I-STAT TROPONIN, ED
Troponin i, poc: 0 ng/mL (ref 0.00–0.08)
Troponin i, poc: 0.01 ng/mL (ref 0.00–0.08)

## 2018-03-03 MED ORDER — SODIUM CHLORIDE 0.9 % IV SOLN
INTRAVENOUS | Status: DC
  Administered 2018-03-03: 04:00:00 via INTRAVENOUS

## 2018-03-03 MED ORDER — SODIUM CHLORIDE 0.9 % IV BOLUS
1000.0000 mL | Freq: Once | INTRAVENOUS | Status: AC
  Administered 2018-03-03: 1000 mL via INTRAVENOUS

## 2018-03-03 MED ORDER — SODIUM CHLORIDE 0.9 % IV BOLUS
500.0000 mL | Freq: Once | INTRAVENOUS | Status: AC
  Administered 2018-03-03: 500 mL via INTRAVENOUS

## 2018-03-03 MED ORDER — POTASSIUM CHLORIDE CRYS ER 20 MEQ PO TBCR: 20.0000 meq | EXTENDED_RELEASE_TABLET | Freq: Two times a day (BID) | ORAL | 0 refills | Status: DC

## 2018-03-03 MED ORDER — POTASSIUM CHLORIDE CRYS ER 20 MEQ PO TBCR
40.0000 meq | EXTENDED_RELEASE_TABLET | Freq: Once | ORAL | Status: AC
  Administered 2018-03-03: 40 meq via ORAL
  Filled 2018-03-03: qty 2

## 2018-03-03 NOTE — ED Notes (Signed)
Pt verbalized that he felt much better after the first bag of fluids.

## 2018-03-03 NOTE — ED Notes (Signed)
Pt denies any pacemaker

## 2018-03-03 NOTE — ED Triage Notes (Signed)
Per EMS, pt was at work and began to experience sudden onset of weakness, cool, pale, sweaty and N/V. 12 lead OK, no pain, medical hx and NDKA.

## 2018-03-03 NOTE — ED Provider Notes (Signed)
[facility] EMERGENCY DEPARTMENT Provider Note  CSN: [id number] Arrival date & time: 03/03/18 0206  Chief Complaint(s) Weakness  HPI Darryl Fletcher is a 62 y.o. male with a history of high blood pressure and partial seizures who presents to the emergency department with a near syncopal episode.  Patient reports that for the past several days he has had URI symptoms.  Today he took Mucinex DM prior to going to work.  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  Coworkers arrived and noted that the patient was diaphoretic, cool, pale.  He had an episode of nausea with nonbloody nonbilious emesis.  Patient never lost consciousness.  There was no reported seizure activity.  He  Denied associated headache, focal weakness, chest pain, SOB, abd pain.   Upon EMS arrival, they noted patient's blood pressures were in the low 100s, and he had a stable CBG.   HPI  Past Medical History Past Medical History:  Diagnosis Date  . High blood pressure   . Seizures [facility])    Patient Active Problem List   Diagnosis Date Noted  . 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 Snoring 02/08/2018  . Sleep related choking sensation 02/08/2018  . OSA on CPAP 02/08/2018  . Therapeutic drug monitoring 07/25/2017  . Apnea, sleep 07/25/2017  . Partial epilepsy with impairment of consciousness ([place]) 04/09/2013   Home Medication(s) Prior to Admission medications   Medication Sig Start Date End Date Taking? Authorizing Provider  amLODipine-benazepril (LOTREL) 10-20 MG per capsule 1 capsule daily. 01/24/13   [provider]  CARBATROL 300 MG 12 hr capsule TAKE 1 CAPSULE BY MOUTH TWO TIMES DAILY 07/26/17   Dennie Bible, NP  COMBIGAN 0.2-0.5 % ophthalmic solution  05/07/15   [provider]  dorzolamide-timolol (COSOPT) 22.3-6.8 MG/ML ophthalmic solution 1 drop 2 (two) times daily.    [provider]  hydrochlorothiazide (HYDRODIURIL) 25 MG tablet Take 25 mg by mouth daily.    [provider]  ibuprofen (ADVIL,MOTRIN) 800 MG tablet Take 1 tablet (800 mg total) by mouth every 8 (eight) hours as needed. 12/12/17   Newt Minion, MD  potassium chloride SA (K-DUR,KLOR-CON) 20 MEQ tablet Take 1 tablet (20 mEq total) by mouth 2 (two) times daily for 14 days. 03/03/18 03/17/18  Fatima Blank, MD  Travoprost, BAK Free, (TRAVATAN) 0.004 % SOLN ophthalmic solution 1 drop at bedtime.    [provider]                                                                                                                                    Past Surgical History Past Surgical History:  Procedure Laterality Date  . HERNIA REPAIR     Family History Family History  Problem Relation Age of Onset  . Colon cancer Father   .  Diabetes Sister     Social History Social History   Tobacco Use  . Smoking status: Never Smoker  . Smokeless tobacco: Never Used  Substance Use Topics  . Alcohol use: No    Alcohol/week: 0.0 standard drinks  . Drug use: No   Allergies Patient has no known allergies.  Review of Systems Review of Systems All other systems are reviewed and are negative for acute change except as noted in the HPI  Physical Exam Vital Signs  I have reviewed the triage vital signs BP 113/75   Pulse 71   Temp 97.7 F (36.5 C) (Oral)   Resp 14   SpO2 98%   Physical Exam  Constitutional: He is oriented to person, place, and time. He appears well-developed and well-nourished. No distress.  HENT:  Head: Normocephalic and atraumatic.  Nose: Nose normal.  Eyes: Pupils are equal, round, and reactive to light. Conjunctivae and EOM are normal. Right eye exhibits no discharge. Left eye exhibits no discharge. No scleral icterus.  Neck: Normal range of motion. Neck supple.  Cardiovascular: Normal rate and regular rhythm. Exam reveals no gallop and no friction  rub.  No murmur heard. Pulmonary/Chest: Effort normal and breath sounds normal. No stridor. No respiratory distress. He has no rales.  Abdominal: Soft. He exhibits no distension. There is no tenderness.  Musculoskeletal: He exhibits no edema or tenderness.  Neurological: He is alert and oriented to person, place, and time.  Skin: Skin is warm and dry. No rash noted. He is not diaphoretic. No erythema.  Psychiatric: He has a normal mood and affect.  Vitals reviewed.   ED Results and Treatments Labs (all labs ordered are listed, but only abnormal results are displayed) Labs Reviewed  CBC WITH DIFFERENTIAL/PLATELET - Abnormal; Notable for the following components:      Result Value   WBC 11.0 (*)    Neutro Abs 8.6 (*)    All other components within normal limits  COMPREHENSIVE METABOLIC PANEL - Abnormal; Notable for the following components:   Potassium 2.9 (*)    Glucose, Bld 152 (*)    Creatinine, Ser 1.30 (*)    Calcium 8.6 (*)    GFR calc non Af Amer 58 (*)    All other components within normal limits  CBG MONITORING, ED - Abnormal; Notable for the following components:   Glucose-Capillary 126 (*)    All other components within normal limits  I-STAT TROPONIN, ED  I-STAT TROPONIN, ED                                                                                                                         EKG  EKG Interpretation  Date/Time:  Friday March 03 2018 02:39:03 EST Ventricular Rate:  66 PR Interval:    QRS Duration: 100 QT Interval:  417 QTC Calculation: 437 R Axis:   68 Text Interpretation:  Sinus rhythm No significant change since last tracing Confirmed by Addison Lank [phone]) on 03/03/2018 3:01:36  AM      Radiology No results found. Pertinent labs & imaging results that were available during my care of the patient were reviewed by me and considered in my medical decision making (see chart for details).  Medications Ordered in ED Medications  sodium  chloride 0.9 % bolus 1,000 mL (0 mLs Intravenous Stopped 03/03/18 0356)    And  0.9 %  sodium chloride infusion ( Intravenous Stopped 03/03/18 0612)  sodium chloride 0.9 % bolus 500 mL (0 mLs Intravenous Stopped 03/03/18 0524)  potassium chloride SA (K-DUR,KLOR-CON) CR tablet 40 mEq (40 mEq Oral Given 03/03/18 0444)                                                                                                                                    Procedures Procedures  (including critical care time)  Medical Decision Making / ED Course I have reviewed the nursing notes for this encounter and the patient's prior records (if available in EHR or on provided paperwork).    Near syncope with soft blood pressures.  Patient is afebrile with stable vital signs.  CBG reassuring.  EKG without acute ischemic changes, evidence of pericarditis, HOCM, B[place], epsilon waves.  Serial troponin negative x2.  Work-up negative for anemia.  Mild hypokalemia noted as well as mild AKI.  AKI may be related to possible dehydration.  Orthostatics reassuring.  Patient provided with IV fluids.  The patient appears reasonably screened and/or stabilized for discharge and I doubt any other medical condition or other [facility] requiring further screening, evaluation, or treatment in the ED at this time prior to discharge.  The patient is safe for discharge with strict return precautions.    Final Clinical Impression(s) / ED Diagnoses Final diagnoses:  Near syncope  Hypokalemia    Disposition: Discharge  Condition: Good  I have discussed the results, Dx and Tx plan with the patient who expressed understanding and agree(s) with the plan. Discharge instructions discussed at great length. The patient was given strict return precautions who verbalized understanding of the instructions. No further questions at time of discharge.    ED Discharge Orders         Ordered    potassium chloride SA (K-DUR,KLOR-CON) 20 MEQ tablet  2  times daily     03/03/18 3016           Follow Up: Donald Prose, MD [place] [address] [phone]  Schedule an appointment as soon as possible for a visit       This chart was dictated using voice recognition software.  Despite best efforts to proofread,  errors can occur which can change the documentation meaning.   Fatima Blank, MD 03/03/18 [phone]

## 2018-03-06 DIAGNOSIS — I10 Essential (primary) hypertension: Secondary | ICD-10-CM | POA: Diagnosis not present

## 2018-03-06 DIAGNOSIS — E876 Hypokalemia: Secondary | ICD-10-CM | POA: Diagnosis not present

## 2018-03-06 DIAGNOSIS — R55 Syncope and collapse: Secondary | ICD-10-CM | POA: Diagnosis not present

## 2018-03-20 ENCOUNTER — Telehealth (INDEPENDENT_AMBULATORY_CARE_PROVIDER_SITE_OTHER): Payer: Self-pay | Admitting: Orthopedic Surgery

## 2018-03-20 NOTE — Telephone Encounter (Signed)
Patient called wanting to know what type of orthotic Dr. Sharol Given recommends for him and his plantar fascitis.  CB#[phone]

## 2018-03-21 NOTE — Telephone Encounter (Signed)
I called and lm on vm to advise that the pt can unse OTC inserts or orthotics and a stiff soled shoe per last dictation. To call with any questions.

## 2018-03-31 DIAGNOSIS — K5730 Diverticulosis of large intestine without perforation or abscess without bleeding: Secondary | ICD-10-CM | POA: Diagnosis not present

## 2018-03-31 DIAGNOSIS — Z86010 Personal history of colonic polyps: Secondary | ICD-10-CM | POA: Diagnosis not present

## 2018-03-31 DIAGNOSIS — D122 Benign neoplasm of ascending colon: Secondary | ICD-10-CM | POA: Diagnosis not present

## 2018-03-31 DIAGNOSIS — G4733 Obstructive sleep apnea (adult) (pediatric): Secondary | ICD-10-CM | POA: Diagnosis not present

## 2018-04-12 ENCOUNTER — Encounter: Payer: Self-pay | Admitting: Nurse Practitioner

## 2018-05-01 DIAGNOSIS — G4733 Obstructive sleep apnea (adult) (pediatric): Secondary | ICD-10-CM | POA: Diagnosis not present

## 2018-05-15 DIAGNOSIS — H401122 Primary open-angle glaucoma, left eye, moderate stage: Secondary | ICD-10-CM | POA: Diagnosis not present

## 2018-05-15 DIAGNOSIS — H401111 Primary open-angle glaucoma, right eye, mild stage: Secondary | ICD-10-CM | POA: Diagnosis not present

## 2018-05-22 DIAGNOSIS — N400 Enlarged prostate without lower urinary tract symptoms: Secondary | ICD-10-CM | POA: Diagnosis not present

## 2018-05-29 DIAGNOSIS — R972 Elevated prostate specific antigen [PSA]: Secondary | ICD-10-CM | POA: Diagnosis not present

## 2018-06-01 DIAGNOSIS — G4733 Obstructive sleep apnea (adult) (pediatric): Secondary | ICD-10-CM | POA: Diagnosis not present

## 2018-06-05 DIAGNOSIS — I10 Essential (primary) hypertension: Secondary | ICD-10-CM | POA: Diagnosis not present

## 2018-06-15 DIAGNOSIS — G4733 Obstructive sleep apnea (adult) (pediatric): Secondary | ICD-10-CM | POA: Diagnosis not present

## 2018-06-30 DIAGNOSIS — G4733 Obstructive sleep apnea (adult) (pediatric): Secondary | ICD-10-CM | POA: Diagnosis not present

## 2018-07-19 ENCOUNTER — Ambulatory Visit: Payer: 59 | Admitting: Nurse Practitioner

## 2018-07-21 DIAGNOSIS — G4733 Obstructive sleep apnea (adult) (pediatric): Secondary | ICD-10-CM | POA: Diagnosis not present

## 2018-07-26 ENCOUNTER — Telehealth: Payer: Self-pay | Admitting: Neurology

## 2018-07-26 NOTE — Telephone Encounter (Signed)
I called and left a message to get set up for virtual visit for 4/6.

## 2018-07-27 NOTE — Telephone Encounter (Signed)
Pt has returned the call to NP Judson Roch, he is asking for a call back

## 2018-07-31 ENCOUNTER — Ambulatory Visit (INDEPENDENT_AMBULATORY_CARE_PROVIDER_SITE_OTHER): Payer: 59 | Admitting: Neurology

## 2018-07-31 ENCOUNTER — Ambulatory Visit: Payer: 59 | Admitting: Neurology

## 2018-07-31 ENCOUNTER — Ambulatory Visit: Payer: 59 | Admitting: Nurse Practitioner

## 2018-07-31 ENCOUNTER — Other Ambulatory Visit: Payer: Self-pay

## 2018-07-31 ENCOUNTER — Encounter: Payer: Self-pay | Admitting: Neurology

## 2018-07-31 DIAGNOSIS — G4733 Obstructive sleep apnea (adult) (pediatric): Secondary | ICD-10-CM | POA: Diagnosis not present

## 2018-07-31 DIAGNOSIS — G40209 Localization-related (focal) (partial) symptomatic epilepsy and epileptic syndromes with complex partial seizures, not intractable, without status epilepticus: Secondary | ICD-10-CM

## 2018-07-31 NOTE — Progress Notes (Signed)
Virtual Visit via Video Note  I connected with Darryl Fletcher on 07/31/18 at 11:15 AM EDT by a video enabled telemedicine application and verified that I am speaking with the correct person using two identifiers.   I discussed the limitations of evaluation and management by telemedicine and the availability of in person appointments. The patient expressed understanding and agreed to proceed.  History of Present Illness: 07/31/2018 SS: Mr. Darryl Fletcher is a 63 year old African-American male with history of seizures, obstructive sleep apnea on CPAP.  He is currently taking Carbatrol 300 mg 12-hour tablet twice daily.  He has not had any recent seizures.  He reports his last seizure was 20 years ago.  He reports close follow-up with his primary care doctor, Dr. Nancy Fetter.  He reports he is tolerating medication well.  He denies any dizziness, falls, feeling off balance.  He is currently working full-time at [employer].  He does work night shift.  He reports he has been using his CPAP.  His supplier is [facility].  According to his report he has used his machine 24/30 days (80%).  He has a 37% compliance score for using greater than 4 hours at night.  His average usage for his total days was 3 hours and 11 minutes.  He has a minimum pressure setting of 5, maximum pressure of 12.  His AHI was 2.1.  He denies any problems with his machine for leaks with his mask.  He reports his wife ensures that he uses his machine regularly.  He does report sometimes he is so tired he forgets to put the machine on.  He denies any changes to his medications or his medical history.  He denies any new problems or concerns. He reports he is sleeping about 7 hours per night.  07/25/2017 CM.Mr. Darryl Fletcher, 63 year old black male returns for yearly followup. He has a history of complex partial seizure disorder currently well controlled on Carbatrol brand and no seizure activity in 19.5 years. His labs are drawn by his primary  care doctor , Dr. Nancy Fetter and he claims they were within normal limits at last check. He denies any balance issues,,  any staring spells, any episodes of confusion,  He denies any side effects to the Carbatrol. He continues to work out 3 times a week.  He has no new complaints of witnessed apneas by his wife, and he is wanting to have a sleep study.  He also works third shift.  He denies any morning headaches, he returns for reevaluation. He had previously been on Dilantin but  had difficulty with his gums.   Observations/Objective: I reviewed his medications, medical history  Alert, answers questions appropriately, speech is clear and concise, follows commands, facial symmetry, symmetric shoulder shrug, able to perform finger-to-nose with arms outstretched, gait is intact  Assessment and Plan: 1.  Seizures 2.  Obstructive sleep apnea  Overall he appears to be doing quite well.  He has not had any recent seizures.  He will continue taking Carbatrol 300 mg 12-hour tablet twice daily.  I will send refill when lab results, he reports he doesn't need one now.  I will check a Carbatrol blood level today.  He can come in the office in the 1-2 weeks to have this done.  He reports his primary care doctor checks basic labs.  I advised him to obtain a copy and send them to our office for review.  He reports compliance with his CPAP machine for more  than 37% of the time for use greater than 4 hours.  He believes his machine may not be accounting for his daytime sleeping, since he works night shift, sleeps during the day.  Advised him to follow-up with [facility] about the settings. I advised him to wear his CPAP every night for > 4 hours.  He will follow-up in this office in 3 months for a recheck on his CPAP use.  He can follow-up in 1 year for his seizure history.  Follow Up Instructions: 3 months for revisit for CPAP (37% compliance > 4 hours usage every night), 1 year for seizures   I discussed the assessment  and treatment plan with the patient. The patient was provided an opportunity to ask questions and all were answered. The patient agreed with the plan and demonstrated an understanding of the instructions.   The patient was advised to call back or seek an in-person evaluation if the symptoms worsen or if the condition fails to improve as anticipated.  I provided 15 minutes of non-face-to-face time during this encounter.   Evangeline Dakin, DNP  [facility] [address] [place], [place] [address] [phone]

## 2018-07-31 NOTE — Progress Notes (Signed)
I have read the note, and I agree with the clinical assessment and plan.  Kemberly Taves K Drew Lips   

## 2018-08-02 ENCOUNTER — Telehealth: Payer: Self-pay | Admitting: Neurology

## 2018-08-02 ENCOUNTER — Other Ambulatory Visit: Payer: Self-pay

## 2018-08-02 ENCOUNTER — Other Ambulatory Visit (INDEPENDENT_AMBULATORY_CARE_PROVIDER_SITE_OTHER): Payer: Self-pay

## 2018-08-02 DIAGNOSIS — Z0289 Encounter for other administrative examinations: Secondary | ICD-10-CM

## 2018-08-02 NOTE — Telephone Encounter (Signed)
Darryl Fletcher had his primary care office fax lab work, basic metabolic panel was done June 05, 2018.  Glucose 108, creatinine 1.44 sodium 138, potassium 4.0.  Liver function was not checked.  In November 2019 a CMP was done, AST 22, ALT 20, within normal limits.

## 2018-08-03 ENCOUNTER — Telehealth: Payer: Self-pay | Admitting: *Deleted

## 2018-08-03 ENCOUNTER — Other Ambulatory Visit: Payer: Self-pay

## 2018-08-03 LAB — CARBAMAZEPINE LEVEL, TOTAL: Carbamazepine (Tegretol), S: 13.4 ug/mL (ref 4.0–12.0)

## 2018-08-03 NOTE — Telephone Encounter (Signed)
I called the patient.  He is not having any signs of toxicity.  He is tolerating medication well.  We discussed signs and symptoms of toxicity.  He will continue taking Carbatrol his current dose.  He will call for any dose adjustments or if he develops any problems.

## 2018-08-03 NOTE — Telephone Encounter (Signed)
I called the patient and his Carbatrol level has resulted, is 13.4.  During our visit he was not showing or exhibiting any signs of toxicity.  I am guessing this lab was not a trough level.  We will call him to confirm patient no signs of toxicity otherwise okay to refill his Carbatrol.  I left a message asking him to call back.

## 2018-08-03 NOTE — Telephone Encounter (Signed)
LMVM for pt checking on your carbatrol level, but you did have done and level is in,  Will forward to SS/NP and see what she wants to do relating to your refill to [facility].

## 2018-08-04 MED ORDER — CARBATROL 300 MG PO CP12: ORAL_CAPSULE | ORAL | 1 refills | Status: DC

## 2018-08-04 NOTE — Telephone Encounter (Signed)
I will send in a refill for his Carbatrol. Let's do a revisit in 6 months to recheck his lab.

## 2018-08-07 ENCOUNTER — Other Ambulatory Visit: Payer: 59

## 2018-08-07 NOTE — Telephone Encounter (Signed)
LMVM for pt to return call and make 73mo f/u appt 01/2019 with Butler Denmark, NP.

## 2018-08-08 NOTE — Telephone Encounter (Signed)
Appointment made in 10/20 with Dr. Jannifer Franklin.

## 2018-08-28 ENCOUNTER — Ambulatory Visit: Payer: 59 | Admitting: Neurology

## 2018-08-28 DIAGNOSIS — N400 Enlarged prostate without lower urinary tract symptoms: Secondary | ICD-10-CM | POA: Diagnosis not present

## 2018-08-30 DIAGNOSIS — G4733 Obstructive sleep apnea (adult) (pediatric): Secondary | ICD-10-CM | POA: Diagnosis not present

## 2018-10-16 ENCOUNTER — Other Ambulatory Visit: Payer: Self-pay | Admitting: *Deleted

## 2018-10-16 DIAGNOSIS — Z20822 Contact with and (suspected) exposure to covid-19: Secondary | ICD-10-CM

## 2018-10-22 LAB — NOVEL CORONAVIRUS, NAA: SARS-CoV-2, NAA: NOT DETECTED

## 2018-12-13 ENCOUNTER — Telehealth: Payer: Self-pay | Admitting: *Deleted

## 2018-12-13 NOTE — Telephone Encounter (Signed)
-----   Message from Penni Bombard, MD sent at 12/12/2018  4:49 PM EDT ----- Labs from PCP; kidney function elevated. Per PCP. -VRP

## 2018-12-13 NOTE — Telephone Encounter (Signed)
I spoke to patient and he is aware of the message below.  He plans to contact his PCP to follow up.

## 2018-12-14 ENCOUNTER — Other Ambulatory Visit: Payer: Self-pay | Admitting: Neurology

## 2019-01-29 ENCOUNTER — Other Ambulatory Visit: Payer: Self-pay

## 2019-01-29 ENCOUNTER — Ambulatory Visit: Payer: 59 | Admitting: Neurology

## 2019-01-29 ENCOUNTER — Telehealth: Payer: Self-pay | Admitting: Neurology

## 2019-01-29 ENCOUNTER — Encounter: Payer: Self-pay | Admitting: Neurology

## 2019-01-29 VITALS — BP 130/75 | HR 62 | Temp 97.7°F | Ht 74.0 in | Wt 217.0 lb

## 2019-01-29 DIAGNOSIS — Z5181 Encounter for therapeutic drug level monitoring: Secondary | ICD-10-CM

## 2019-01-29 DIAGNOSIS — G40209 Localization-related (focal) (partial) symptomatic epilepsy and epileptic syndromes with complex partial seizures, not intractable, without status epilepticus: Secondary | ICD-10-CM | POA: Diagnosis not present

## 2019-01-29 MED ORDER — CARBATROL 300 MG PO CP12: ORAL_CAPSULE | ORAL | 3 refills | Status: DC

## 2019-01-29 NOTE — Telephone Encounter (Signed)
Pt has called stating that a new script needs to be sent to [facility]  For pt's CARBATROL 300 MG 12 hr capsule

## 2019-01-29 NOTE — Addendum Note (Signed)
Addended by: Verlin Grills T on: 01/29/2019 11:10 AM   Modules accepted: Orders

## 2019-01-29 NOTE — Patient Instructions (Signed)
Start Vitamin D 1000 to 2000 IU daily.

## 2019-01-29 NOTE — Telephone Encounter (Signed)
Rx has been sent  

## 2019-01-29 NOTE — Progress Notes (Signed)
Reason for visit: Seizures  Darryl Fletcher is an 63 y.o. male  History of present illness:  Darryl Fletcher is a 63 year old black male with a history of seizures that been well controlled.  He is on Carbatrol taking 300 mg twice daily.  He tolerates the drug well without drowsiness or gait instability.  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  He is able to operate a motor vehicle.  He is on CPAP for sleep apnea.  He returns the office today for an evaluation.  Past Medical History:  Diagnosis Date  . High blood pressure   . Seizures ([place])     Past Surgical History:  Procedure Laterality Date  . HERNIA REPAIR      Family History  Problem Relation Age of Onset  . Colon cancer Father   . Diabetes Sister     Social history:  reports that he has never smoked. He has never used smokeless tobacco. He reports that he does not drink alcohol or use drugs.   No Known Allergies  Medications:  Prior to Admission medications   Medication Sig Start Date End Date Taking? Authorizing Provider  amLODipine-benazepril (LOTREL) 10-20 MG per capsule 1 capsule daily. 01/24/13  Yes [provider]  CARBATROL 300 MG 12 hr capsule TAKE 1 CAPSULE BY MOUTH  TWICE DAILY 12/14/18  Yes Suzzanne Cloud, NP  COMBIGAN 0.2-0.5 % ophthalmic solution  05/07/15  Yes [provider]  dorzolamide-timolol (COSOPT) 22.3-6.8 MG/ML ophthalmic solution 1 drop 2 (two) times daily.   Yes [provider]  hydrochlorothiazide (HYDRODIURIL) 25 MG tablet Take 25 mg by mouth daily.   Yes [provider]  ibuprofen (ADVIL,MOTRIN) 800 MG tablet Take 1 tablet (800 mg total) by mouth every 8 (eight) hours as needed. 12/12/17  Yes Newt Minion, MD  Travoprost, BAK Free, (TRAVATAN) 0.004 % SOLN ophthalmic solution 1 drop at bedtime.   Yes [provider]  potassium chloride SA (K-DUR,KLOR-CON) 20 MEQ tablet Take 1 tablet (20 mEq total) by mouth 2 (two) times  daily for 14 days. 03/03/18 03/17/18  Fatima Blank, MD    ROS:  Out of a complete 14 system review of symptoms, the patient complains only of the following symptoms, and all other reviewed systems are negative.  Seizures  Blood pressure 130/75, pulse 62, temperature 97.7 F (36.5 C), temperature source Temporal, height 6\' 2"  (1.88 m), weight 217 lb (98.4 kg).  Physical Exam  General: The patient is alert and cooperative at the time of the examination.  Skin: No significant peripheral edema is noted.   Neurologic Exam  Mental status: The patient is alert and oriented x 3 at the time of the examination. The patient has apparent normal recent and remote memory, with an apparently normal attention span and concentration ability.   Cranial nerves: Facial symmetry is present. Speech is normal, no aphasia or dysarthria is noted. Extraocular movements are full. Visual fields are full.  Motor: The patient has good strength in all 4 extremities.  Sensory examination: Soft touch sensation is symmetric on the face, arms, and legs.  Coordination: The patient has good finger-nose-finger and heel-to-shin bilaterally.  Gait and station: The patient has a normal gait. Tandem gait is normal. Romberg is negative. No drift is seen.  Reflexes: Deep tendon reflexes are symmetric.   Assessment/Plan:  1.  History of seizures, well controlled  2.  Sleep apnea on CPAP  The patient will  continue the Carbatrol and he will go for blood work today.  He will go on vitamin D supplementation taking 1000 international units daily.  He will follow-up in 1 year, sooner if needed.  Jill Alexanders MD 01/29/2019 10:07 AM  [facility] [address] [place], [place] [address]  Phone [phone] Fax [phone]

## 2019-01-30 LAB — CBC WITH DIFFERENTIAL/PLATELET
Basophils Absolute: 0 10*3/uL (ref 0.0–0.2)
Basos: 1 %
EOS (ABSOLUTE): 0.1 10*3/uL (ref 0.0–0.4)
Eos: 1 %
Hematocrit: 40.4 % (ref 37.5–51.0)
Hemoglobin: 14 g/dL (ref 13.0–17.7)
Immature Grans (Abs): 0 10*3/uL (ref 0.0–0.1)
Immature Granulocytes: 0 %
Lymphocytes Absolute: 2.2 10*3/uL (ref 0.7–3.1)
Lymphs: 32 %
MCH: 32.1 pg (ref 26.6–33.0)
MCHC: 34.7 g/dL (ref 31.5–35.7)
MCV: 93 fL (ref 79–97)
Monocytes Absolute: 0.7 10*3/uL (ref 0.1–0.9)
Monocytes: 10 %
Neutrophils Absolute: 3.9 10*3/uL (ref 1.4–7.0)
Neutrophils: 56 %
Platelets: 203 10*3/uL (ref 150–450)
RBC: 4.36 x10E6/uL (ref 4.14–5.80)
RDW: 13.2 % (ref 11.6–15.4)
WBC: 7 10*3/uL (ref 3.4–10.8)

## 2019-01-30 LAB — COMPREHENSIVE METABOLIC PANEL
ALT: 18 IU/L (ref 0–44)
AST: 22 IU/L (ref 0–40)
Albumin/Globulin Ratio: 1.7 (ref 1.2–2.2)
Albumin: 4.7 g/dL (ref 3.8–4.8)
Alkaline Phosphatase: 84 IU/L (ref 39–117)
BUN/Creatinine Ratio: 11 (ref 10–24)
BUN: 16 mg/dL (ref 8–27)
Bilirubin Total: 0.3 mg/dL (ref 0.0–1.2)
CO2: 26 mmol/L (ref 20–29)
Calcium: 9.5 mg/dL (ref 8.6–10.2)
Chloride: 101 mmol/L (ref 96–106)
Creatinine, Ser: 1.46 mg/dL — ABNORMAL HIGH (ref 0.76–1.27)
GFR calc Af Amer: 59 mL/min/{1.73_m2} — ABNORMAL LOW (ref >59)
GFR calc non Af Amer: 51 mL/min/{1.73_m2} — ABNORMAL LOW (ref >59)
Globulin, Total: 2.8 g/dL (ref 1.5–4.5)
Glucose: 90 mg/dL (ref 65–99)
Potassium: 4.1 mmol/L (ref 3.5–5.2)
Sodium: 140 mmol/L (ref 134–144)
Total Protein: 7.5 g/dL (ref 6.0–8.5)

## 2019-01-30 LAB — CARBAMAZEPINE LEVEL, TOTAL: Carbamazepine (Tegretol), S: 12.2 ug/mL (ref 4.0–12.0)

## 2019-02-26 ENCOUNTER — Other Ambulatory Visit (INDEPENDENT_AMBULATORY_CARE_PROVIDER_SITE_OTHER): Payer: Self-pay | Admitting: Orthopedic Surgery

## 2019-03-14 ENCOUNTER — Other Ambulatory Visit (INDEPENDENT_AMBULATORY_CARE_PROVIDER_SITE_OTHER): Payer: Self-pay | Admitting: Orthopedic Surgery

## 2019-04-06 ENCOUNTER — Telehealth: Payer: Self-pay | Admitting: Orthopedic Surgery

## 2019-04-06 ENCOUNTER — Other Ambulatory Visit: Payer: Self-pay

## 2019-04-06 MED ORDER — IBUPROFEN 800 MG PO TABS: 800.0000 mg | ORAL_TABLET | Freq: Three times a day (TID) | ORAL | 3 refills | Status: DC | PRN

## 2019-04-06 NOTE — Telephone Encounter (Signed)
Will call patient and notify that his medication is sent to his pharmacy.

## 2019-04-06 NOTE — Telephone Encounter (Signed)
Patient called left voicemail message needing Rx refilled (Ibuprofen) The number to contact patient is [phone]

## 2019-04-11 ENCOUNTER — Telehealth: Payer: Self-pay | Admitting: Orthopedic Surgery

## 2019-04-11 ENCOUNTER — Other Ambulatory Visit: Payer: Self-pay

## 2019-04-11 MED ORDER — IBUPROFEN 800 MG PO TABS: 800.0000 mg | ORAL_TABLET | Freq: Three times a day (TID) | ORAL | 3 refills | Status: DC | PRN

## 2019-04-11 NOTE — Telephone Encounter (Signed)
Patient Rx was refilled for Ibuprofen and sent to pharmacy of choice. Patient notified.

## 2019-04-11 NOTE — Telephone Encounter (Signed)
Patient called. [facility] would [facility] [address] [id number] [place], [place]

## 2019-04-12 ENCOUNTER — Telehealth: Payer: Self-pay | Admitting: Orthopedic Surgery

## 2019-04-12 ENCOUNTER — Other Ambulatory Visit: Payer: Self-pay

## 2019-04-12 NOTE — Telephone Encounter (Signed)
Patient called requesting Ibuprofen prescription be refilled at [facility] on [address]. Patient requested call  Back for confirmation of medication called in. Patient phone number is [phone].

## 2019-04-12 NOTE — Telephone Encounter (Signed)
Patient was called and informed [facility] [address] and he said he will pick up from there.

## 2020-01-28 ENCOUNTER — Ambulatory Visit (INDEPENDENT_AMBULATORY_CARE_PROVIDER_SITE_OTHER): Payer: 59 | Admitting: Neurology

## 2020-01-28 ENCOUNTER — Encounter: Payer: Self-pay | Admitting: Neurology

## 2020-01-28 ENCOUNTER — Other Ambulatory Visit: Payer: Self-pay

## 2020-01-28 VITALS — BP 122/78 | HR 84 | Ht 74.0 in | Wt 217.0 lb

## 2020-01-28 DIAGNOSIS — G40209 Localization-related (focal) (partial) symptomatic epilepsy and epileptic syndromes with complex partial seizures, not intractable, without status epilepticus: Secondary | ICD-10-CM

## 2020-01-28 MED ORDER — CARBATROL 300 MG PO CP12: ORAL_CAPSULE | ORAL | 3 refills | Status: DC

## 2020-01-28 NOTE — Patient Instructions (Signed)
Continue current medications  Come next Monday for blood work first thing in the morning  Call for seizures See you back in 1 year or sooner if needed

## 2020-01-28 NOTE — Progress Notes (Signed)
PATIENT: Darryl Fletcher DOB: [birth year]  REASON FOR VISIT: follow up HISTORY FROM: patient  HISTORY OF PRESENT ILLNESS: Today 01/28/20 Darryl Fletcher is a 64 year old male with history of seizures that are well controlled, it has been years since last seizure.  He is on Carbatrol 300 mg twice a day.  Tolerating the medication well.  He works full-time night shift at [employer].  He has CPAP for sleep apnea.  Previous carbamazepine levels have been on the higher end.  Reported health has been stable.  Presents today for evaluation unaccompanied.  HISTORY 01/29/2019 Dr. Jannifer Fletcher: Darryl Fletcher is a 64 year old black male with a history of seizures that been well controlled.  He is on Carbatrol taking 300 mg twice daily.  He tolerates the drug well without drowsiness or gait instability.  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  He is able to operate a motor vehicle.  He is on CPAP for sleep apnea.  He returns the office today for an evaluation.   REVIEW OF SYSTEMS: Out of a complete 14 system review of symptoms, the patient complains only of the following symptoms, and all other reviewed systems are negative.  N/A  ALLERGIES: No Known Allergies  HOME MEDICATIONS: Outpatient Medications Prior to Visit  Medication Sig Dispense Refill  . amLODipine-benazepril (LOTREL) 10-20 MG per capsule 1 capsule daily.    . COMBIGAN 0.2-0.5 % ophthalmic solution     . dorzolamide-timolol (COSOPT) 22.3-6.8 MG/ML ophthalmic solution 1 drop 2 (two) times daily.    . hydrochlorothiazide (HYDRODIURIL) 25 MG tablet Take 25 mg by mouth daily.    Marland Kitchen ibuprofen (ADVIL) 800 MG tablet Take 1 tablet (800 mg total) by mouth every 8 (eight) hours as needed. 60 tablet 3  . sildenafil (VIAGRA) 100 MG tablet Take 100 mg by mouth daily as needed.    . Travoprost, BAK Free, (TRAVATAN) 0.004 % SOLN ophthalmic solution 1 drop at bedtime.    Marland Kitchen CARBATROL 300 MG 12 hr capsule TAKE 1  CAPSULE BY MOUTH  TWICE DAILY 180 capsule 3  . potassium chloride SA (K-DUR,KLOR-CON) 20 MEQ tablet Take 1 tablet (20 mEq total) by mouth 2 (two) times daily for 14 days. 28 tablet 0   No facility-administered medications prior to visit.    PAST MEDICAL HISTORY: Past Medical History:  Diagnosis Date  . High blood pressure   . Seizures ([place])     PAST SURGICAL HISTORY: Past Surgical History:  Procedure Laterality Date  . HERNIA REPAIR      FAMILY HISTORY: Family History  Problem Relation Age of Onset  . Colon cancer Father   . Diabetes Sister     SOCIAL HISTORY: Social History   Socioeconomic History  . Marital status: Married    Spouse name: Darryl Fletcher  . Number of children: 3  . Years of education: 58  . Highest education level: Not on file  Occupational History    Employer: [employer]  Tobacco Use  . Smoking status: Never Smoker  . Smokeless tobacco: Never Used  Substance and Sexual Activity  . Alcohol use: No    Alcohol/week: 0.0 standard drinks  . Drug use: No  . Sexual activity: Not on file  Other Topics Concern  . Not on file  Social History Narrative   Patient is married and lives at home with his wife and    Patient has three children.   Patient works as a Administrator  for [employer].   Patient has a high school education.   Patient drinks caffeine- 3-4 times per week.      Social Determinants of Health   Financial Resource Strain:   . Difficulty of Paying Living Expenses: Not on file  Food Insecurity:   . Worried About Charity fundraiser in the Last Year: Not on file  . Ran Out of Food in the Last Year: Not on file  Transportation Needs:   . Lack of Transportation (Medical): Not on file  . Lack of Transportation (Non-Medical): Not on file  Physical Activity:   . Days of Exercise per Week: Not on file  . Minutes of Exercise per Session: Not on file  Stress:   . Feeling of Stress : Not on file  Social Connections:   . Frequency of  Communication with Friends and Family: Not on file  . Frequency of Social Gatherings with Friends and Family: Not on file  . Attends Religious Services: Not on file  . Active Member of Clubs or Organizations: Not on file  . Attends Archivist Meetings: Not on file  . Marital Status: Not on file  Intimate Partner Violence:   . Fear of Current or Ex-Partner: Not on file  . Emotionally Abused: Not on file  . Physically Abused: Not on file  . Sexually Abused: Not on file  PHYSICAL EXAM  Vitals:   01/28/20 0902  BP: 122/78  Pulse: 84  Weight: 217 lb (98.4 kg)  Height: 6\' 2"  (1.88 m)   Body mass index is 27.86 kg/m.  Generalized: Well developed, in no acute distress   Neurological examination  Mentation: Alert oriented to time, place, history taking. Follows all commands speech and language fluent Cranial nerve II-XII: Pupils were equal round reactive to light. Extraocular movements were full, visual field were full on confrontational test. Facial sensation and strength were normal.  Head turning and shoulder shrug  were normal and symmetric. Motor: The motor testing reveals 5 over 5 strength of all 4 extremities. Good symmetric motor tone is noted throughout.  Sensory: Sensory testing is intact to soft touch on all 4 extremities. No evidence of extinction is noted.  Coordination: Cerebellar testing reveals good finger-nose-finger and heel-to-shin bilaterally.  Gait and station: Gait is normal.  Reflexes: Deep tendon reflexes are symmetric and normal bilaterally.   DIAGNOSTIC DATA (LABS, IMAGING, TESTING) - I reviewed patient records, labs, notes, testing and imaging myself where available.  Lab Results  Component Value Date   WBC 7.0 01/29/2019   HGB 14.0 01/29/2019   HCT 40.4 01/29/2019   MCV 93 01/29/2019   PLT 203 01/29/2019      Component Value Date/Time   NA 140 01/29/2019 1018   K 4.1 01/29/2019 1018   CL 101 01/29/2019 1018   CO2 26 01/29/2019 1018    GLUCOSE 90 01/29/2019 1018   GLUCOSE 152 (H) 03/03/2018 0229   BUN 16 01/29/2019 1018   CREATININE 1.46 (H) 01/29/2019 1018   CALCIUM 9.5 01/29/2019 1018   PROT 7.5 01/29/2019 1018   ALBUMIN 4.7 01/29/2019 1018   AST 22 01/29/2019 1018   ALT 18 01/29/2019 1018   ALKPHOS 84 01/29/2019 1018   BILITOT 0.3 01/29/2019 1018   GFRNONAA 51 (L) 01/29/2019 1018   GFRAA 59 (L) 01/29/2019 1018   No results found for: CHOL, HDL, LDLCALC, LDLDIRECT, TRIG, CHOLHDL No results found for: HGBA1C No results found for: VITAMINB12 No results found for: TSH  ASSESSMENT  AND PLAN 64 y.o. year old male  has a past medical history of High blood pressure and Seizures ([place]). here with:  1.  History of seizures, well controlled 2.  Sleep apnea on CPAP  -Continue Carbatrol 300 mg twice a day -Come next Monday for trough blood work (previous levels on higher end) -Call for recurrent seizure, follow-up 1 year or sooner if needed  I spent 20 minutes of face-to-face and non-face-to-face time with patient.  This included previsit chart review, lab review, study review, order entry, electronic health record documentation, patient education.  Butler Denmark, AGNP-C, DNP 01/28/2020, 9:32 AM [facility] [address], [place], [place] [address] [phone]

## 2020-01-28 NOTE — Progress Notes (Signed)
I have read the note, and I agree with the clinical assessment and plan.  Gyselle Matthew K Charm Stenner   

## 2020-01-31 ENCOUNTER — Telehealth: Payer: Self-pay | Admitting: Neurology

## 2020-01-31 NOTE — Telephone Encounter (Signed)
Pt is asking for a call back from [facility]

## 2020-01-31 NOTE — Telephone Encounter (Signed)
Left message for patient to call back  

## 2020-01-31 NOTE — Telephone Encounter (Signed)
-----   Message from Suzzanne Cloud, NP sent at 01/28/2020  9:26 AM EDT ----- Call to come on Monday for trough blood work

## 2020-02-04 ENCOUNTER — Other Ambulatory Visit (INDEPENDENT_AMBULATORY_CARE_PROVIDER_SITE_OTHER): Payer: Self-pay

## 2020-02-04 DIAGNOSIS — G40209 Localization-related (focal) (partial) symptomatic epilepsy and epileptic syndromes with complex partial seizures, not intractable, without status epilepticus: Secondary | ICD-10-CM

## 2020-02-04 DIAGNOSIS — Z0289 Encounter for other administrative examinations: Secondary | ICD-10-CM

## 2020-02-05 LAB — CBC WITH DIFFERENTIAL/PLATELET
Basophils Absolute: 0 10*3/uL (ref 0.0–0.2)
Basos: 1 %
EOS (ABSOLUTE): 0 10*3/uL (ref 0.0–0.4)
Eos: 1 %
Hematocrit: 39.1 % (ref 37.5–51.0)
Hemoglobin: 13.6 g/dL (ref 13.0–17.7)
Immature Grans (Abs): 0 10*3/uL (ref 0.0–0.1)
Immature Granulocytes: 0 %
Lymphocytes Absolute: 1.6 10*3/uL (ref 0.7–3.1)
Lymphs: 27 %
MCH: 31.7 pg (ref 26.6–33.0)
MCHC: 34.8 g/dL (ref 31.5–35.7)
MCV: 91 fL (ref 79–97)
Monocytes Absolute: 0.6 10*3/uL (ref 0.1–0.9)
Monocytes: 10 %
Neutrophils Absolute: 3.8 10*3/uL (ref 1.4–7.0)
Neutrophils: 61 %
Platelets: 202 10*3/uL (ref 150–450)
RBC: 4.29 x10E6/uL (ref 4.14–5.80)
RDW: 13.1 % (ref 11.6–15.4)
WBC: 6.1 10*3/uL (ref 3.4–10.8)

## 2020-02-05 LAB — COMPREHENSIVE METABOLIC PANEL
ALT: 20 IU/L (ref 0–44)
AST: 20 IU/L (ref 0–40)
Albumin/Globulin Ratio: 1.6 (ref 1.2–2.2)
Albumin: 4.3 g/dL (ref 3.8–4.8)
Alkaline Phosphatase: 78 IU/L (ref 44–121)
BUN/Creatinine Ratio: 13 (ref 10–24)
BUN: 17 mg/dL (ref 8–27)
Bilirubin Total: 0.2 mg/dL (ref 0.0–1.2)
CO2: 22 mmol/L (ref 20–29)
Calcium: 8.9 mg/dL (ref 8.6–10.2)
Chloride: 104 mmol/L (ref 96–106)
Creatinine, Ser: 1.36 mg/dL — ABNORMAL HIGH (ref 0.76–1.27)
GFR calc Af Amer: 64 mL/min/{1.73_m2} (ref >59)
GFR calc non Af Amer: 55 mL/min/{1.73_m2} — ABNORMAL LOW (ref >59)
Globulin, Total: 2.7 g/dL (ref 1.5–4.5)
Glucose: 108 mg/dL — ABNORMAL HIGH (ref 65–99)
Potassium: 3.7 mmol/L (ref 3.5–5.2)
Sodium: 141 mmol/L (ref 134–144)
Total Protein: 7 g/dL (ref 6.0–8.5)

## 2020-02-05 LAB — CARBAMAZEPINE LEVEL, TOTAL: Carbamazepine (Tegretol), S: 11.9 ug/mL (ref 4.0–12.0)

## 2020-04-24 ENCOUNTER — Other Ambulatory Visit (INDEPENDENT_AMBULATORY_CARE_PROVIDER_SITE_OTHER): Payer: Self-pay | Admitting: Family

## 2020-06-17 ENCOUNTER — Encounter: Payer: Self-pay | Admitting: Neurology

## 2020-07-14 ENCOUNTER — Telehealth: Payer: Self-pay

## 2020-07-14 NOTE — Telephone Encounter (Signed)
Appt made for 07/21/20

## 2020-07-14 NOTE — Telephone Encounter (Signed)
Patient called he is requesting a rx refill for ibuprofen call back:[phone]

## 2020-07-14 NOTE — Telephone Encounter (Signed)
We have not seen pt in office since 12/2017 please call for appt. Can not refill rx at this time.

## 2020-07-21 ENCOUNTER — Encounter: Payer: Self-pay | Admitting: Orthopedic Surgery

## 2020-07-21 ENCOUNTER — Ambulatory Visit (INDEPENDENT_AMBULATORY_CARE_PROVIDER_SITE_OTHER): Payer: 59 | Admitting: Orthopedic Surgery

## 2020-07-21 ENCOUNTER — Ambulatory Visit: Payer: Self-pay

## 2020-07-21 DIAGNOSIS — M5410 Radiculopathy, site unspecified: Secondary | ICD-10-CM

## 2020-07-21 DIAGNOSIS — M79661 Pain in right lower leg: Secondary | ICD-10-CM | POA: Diagnosis not present

## 2020-07-21 MED ORDER — PREDNISONE 10 MG PO TABS: 10.0000 mg | ORAL_TABLET | Freq: Every day | ORAL | 0 refills | Status: DC

## 2020-07-21 MED ORDER — IBUPROFEN 800 MG PO TABS: 800.0000 mg | ORAL_TABLET | Freq: Three times a day (TID) | ORAL | 3 refills | Status: DC | PRN

## 2020-07-21 NOTE — Progress Notes (Signed)
Office Visit Note   Patient: Darryl Fletcher           Date of Birth: Nov 02, 1955           MRN: [id number] Visit Date: 07/21/2020              Requested by: Donald Prose, MD [address],  [place] [address] PCP: Donald Prose, MD  Chief Complaint  Patient presents with  . Right Leg - Pain      HPI: Patient is a 65 year old gentleman who presents with right-sided radicular pain that radiates from the right buttock into the anterior aspect of the right thigh and posteriorly the right calf.  Patient states he had to lift his leg manually to get into the car yesterday.  Patient denies any groin pain denies any radicular symptoms to the foot.  Patient states he has had epidural steroid injections with Dr. Ernestina Patches in the past for left-sided radicular symptoms.  Assessment & Plan: Visit Diagnoses:  1. Pain in right lower leg   2. Radicular pain of right lower extremity     Plan: A prescription is called in for prednisone he will take 10 mg with breakfast and wean off as the symptoms improved.  After is completed the prednisone he is also given a refill prescription for ibuprofen.  If patient is still symptomatic at follow-up will set up an MRI scan for evaluation for epidural steroid injections with Dr. Ernestina Patches.  Follow-Up Instructions: Return in about 2 weeks (around 08/04/2020).   Ortho Exam  Patient is alert, oriented, no adenopathy, well-dressed, normal affect, normal respiratory effort. Examination patient has no pain with range of motion of the hip knee or ankle.  He has a negative straight leg raise with no focal motor weakness in the lower extremity.  Review of the radiographs shows degenerative disc disease with the worst pathology at L2-3 which is consistent with his clinical symptoms.  Imaging: XR Lumbar Spine 2-3 Views  Result Date: 07/21/2020 2 view radiographs of the lumbar spine shows straightening of the lumbar lordosis he has anterior osteophytic bone  spurs at L2-3.  No images are attached to the encounter.  Labs: No results found for: HGBA1C, ESRSEDRATE, CRP, LABURIC, REPTSTATUS, GRAMSTAIN, CULT, LABORGA   Lab Results  Component Value Date   ALBUMIN 4.3 02/04/2020   ALBUMIN 4.7 01/29/2019   ALBUMIN 4.0 03/03/2018    No results found for: MG No results found for: VD25OH  No results found for: PREALBUMIN CBC EXTENDED Latest Ref Rng & Units 02/04/2020 01/29/2019 03/03/2018  WBC 3.4 - 10.8 x10E3/uL 6.1 7.0 11.0(H)  RBC 4.14 - 5.80 x10E6/uL 4.29 4.36 4.28  HGB 13.0 - 17.7 g/dL 13.6 14.0 13.2  HCT 37.5 - 51.0 % 39.1 40.4 40.2  PLT 150 - 450 x10E3/uL 202 203 189  NEUTROABS 1.4 - 7.0 x10E3/uL 3.8 3.9 8.6(H)  LYMPHSABS 0.7 - 3.1 x10E3/uL 1.6 2.2 1.4     There is no height or weight on file to calculate BMI.  Orders:  Orders Placed This Encounter  Procedures  . XR Lumbar Spine 2-3 Views   Meds ordered this encounter  Medications  . ibuprofen (ADVIL) 800 MG tablet    Sig: Take 1 tablet (800 mg total) by mouth every 8 (eight) hours as needed.    Dispense:  60 tablet    Refill:  3  . predniSONE (DELTASONE) 10 MG tablet    Sig: Take 1 tablet (10 mg total) by  mouth daily with breakfast.    Dispense:  30 tablet    Refill:  0     Procedures: No procedures performed  Clinical Data: No additional findings.  ROS:  All other systems negative, except as noted in the HPI. Review of Systems  Objective: Vital Signs: There were no vitals taken for this visit.  Specialty Comments:  No specialty comments available.  PMFS History: Patient Active Problem List   Diagnosis Date Noted  . 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 Snoring 02/08/2018  . Sleep related choking sensation 02/08/2018  . OSA on CPAP 02/08/2018  . Therapeutic drug monitoring 07/25/2017  . Apnea, sleep 07/25/2017  . Partial epilepsy with impairment of consciousness ([facility]) 04/09/2013   Past Medical History:  Diagnosis Date  . High blood  pressure   . Seizures ([place])     Family History  Problem Relation Age of Onset  . Colon cancer Father   . Diabetes Sister     Past Surgical History:  Procedure Laterality Date  . HERNIA REPAIR     Social History   Occupational History    Employer: [employer]  Tobacco Use  . Smoking status: Never Smoker  . Smokeless tobacco: Never Used  Substance and Sexual Activity  . Alcohol use: No    Alcohol/week: 0.0 standard drinks  . Drug use: No  . Sexual activity: Not on file

## 2020-07-22 ENCOUNTER — Telehealth: Payer: Self-pay | Admitting: Neurology

## 2020-07-22 NOTE — Telephone Encounter (Signed)
I called pt.  He has not been using his cpap,  He states needs more air.  (pressure).  I relayed that from what I could see that he is not using , only some part today.  I relayed will ask but they may want him to use the machine so we can see how he is doing.  He states he rips it off his face.  He has been seen for sz, lat seen in [facility] 2019 iniital visit but then not seen again.  Pressure 5-12 airsense 10 autoset.

## 2020-07-22 NOTE — Telephone Encounter (Signed)
Pt is asking for a call to discuss getting more air, please call

## 2020-07-23 NOTE — Telephone Encounter (Signed)
Called the patient, there was no answer. Left a detailed message advising the patient that in order for [place] to review that there is a problem with the machine we would need to have more data.  Since there is only 1 or 2 days of data there is not much information for [place] to determine if any changes need to be made.  Also informed on the voicemail that if it is a equipment issue that is causing him not to be able to use the machine, he should reach out to the DME company.  Provided the DME companies phone number.  Advised the patient to call back and schedule an appointment after he is at least use the machine a couple of weeks so that we would have some data to go off of it we need to make changes to the machine itself.

## 2020-07-23 NOTE — Telephone Encounter (Signed)
Also per Hedwig Morton did agree to see pt for follow up when the time comes to schedule

## 2020-08-04 ENCOUNTER — Ambulatory Visit: Payer: 59 | Admitting: Physician Assistant

## 2020-08-04 ENCOUNTER — Encounter: Payer: Self-pay | Admitting: Orthopedic Surgery

## 2020-08-04 DIAGNOSIS — M79661 Pain in right lower leg: Secondary | ICD-10-CM

## 2020-08-04 NOTE — Progress Notes (Signed)
Office Visit Note   Patient: Darryl Fletcher           Date of Birth: 02/16/1956           MRN: [id number] Visit Date: 08/04/2020              Requested by: Donald Prose, MD [address] [place],  [place] [address] PCP: Donald Prose, MD  Chief Complaint  Patient presents with  . Right Leg - Pain      HPI: Patient is a 65 year old gentleman who follows up for his right radicular back pain.  He has been taking prednisone and reports doing much better.  Assessment & Plan: Visit Diagnoses: No diagnosis found.  Plan: Patient will take the prednisone for a few more days then wean off of it.  He may transition back to ibuprofen.  If he has significant resume of symptoms I would recommend an MRI he can call if he wishes to proceed with this.  He could then follow-up with Dr. Ernestina Patches who has helped him in the past with epidural steroid injections for left-sided radiculopathy  Follow-Up Instructions: No follow-ups on file.   Ortho Exam  Patient is alert, oriented, no adenopathy, well-dressed, normal affect, normal respiratory effort. Right lower extremity no tenderness to palpation over the spine or buttock or leg.  He does have a mild straight leg raise on the right good strength with dorsiflexion and plantarflexion has a normal gait.  Imaging: No results found. No images are attached to the encounter.  Labs: No results found for: HGBA1C, ESRSEDRATE, CRP, LABURIC, REPTSTATUS, GRAMSTAIN, CULT, LABORGA   Lab Results  Component Value Date   ALBUMIN 4.3 02/04/2020   ALBUMIN 4.7 01/29/2019   ALBUMIN 4.0 03/03/2018    No results found for: MG No results found for: VD25OH  No results found for: PREALBUMIN CBC EXTENDED Latest Ref Rng & Units 02/04/2020 01/29/2019 03/03/2018  WBC 3.4 - 10.8 x10E3/uL 6.1 7.0 11.0(H)  RBC 4.14 - 5.80 x10E6/uL 4.29 4.36 4.28  HGB 13.0 - 17.7 g/dL 13.6 14.0 13.2  HCT 37.5 - 51.0 % 39.1 40.4 40.2  PLT 150 - 450 x10E3/uL 202 203 189   NEUTROABS 1.4 - 7.0 x10E3/uL 3.8 3.9 8.6(H)  LYMPHSABS 0.7 - 3.1 x10E3/uL 1.6 2.2 1.4     There is no height or weight on file to calculate BMI.  Orders:  No orders of the defined types were placed in this encounter.  No orders of the defined types were placed in this encounter.    Procedures: No procedures performed  Clinical Data: No additional findings.  ROS:  All other systems negative, except as noted in the HPI. Review of Systems  Objective: Vital Signs: There were no vitals taken for this visit.  Specialty Comments:  No specialty comments available.  PMFS History: Patient Active Problem List   Diagnosis Date Noted  . 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 Snoring 02/08/2018  . Sleep related choking sensation 02/08/2018  . OSA on CPAP 02/08/2018  . Therapeutic drug monitoring 07/25/2017  . Apnea, sleep 07/25/2017  . Partial epilepsy with impairment of consciousness ([facility]) 04/09/2013   Past Medical History:  Diagnosis Date  . High blood pressure   . Seizures ([place])     Family History  Problem Relation Age of Onset  . Colon cancer Father   . Diabetes Sister     Past Surgical History:  Procedure Laterality Date  . HERNIA REPAIR  Social History   Occupational History    Employer: [employer]  Tobacco Use  . Smoking status: Never Smoker  . Smokeless tobacco: Never Used  Substance and Sexual Activity  . Alcohol use: No    Alcohol/week: 0.0 standard drinks  . Drug use: No  . Sexual activity: Not on file

## 2020-08-17 ENCOUNTER — Other Ambulatory Visit: Payer: Self-pay | Admitting: Orthopedic Surgery

## 2020-12-30 ENCOUNTER — Other Ambulatory Visit: Payer: Self-pay | Admitting: Neurology

## 2021-02-02 ENCOUNTER — Other Ambulatory Visit: Payer: Self-pay

## 2021-02-02 ENCOUNTER — Ambulatory Visit: Payer: 59 | Admitting: Neurology

## 2021-02-02 ENCOUNTER — Encounter: Payer: Self-pay | Admitting: Neurology

## 2021-02-02 VITALS — BP 116/64 | HR 79 | Ht 73.0 in | Wt 223.5 lb

## 2021-02-02 DIAGNOSIS — G40209 Localization-related (focal) (partial) symptomatic epilepsy and epileptic syndromes with complex partial seizures, not intractable, without status epilepticus: Secondary | ICD-10-CM | POA: Diagnosis not present

## 2021-02-02 DIAGNOSIS — Z5181 Encounter for therapeutic drug level monitoring: Secondary | ICD-10-CM | POA: Diagnosis not present

## 2021-02-02 NOTE — Progress Notes (Signed)
Reason for visit: Seizures  Darryl Fletcher is an 65 y.o. male  History of present illness:  Darryl Fletcher is a 65 year old left-handed black male with a history of well-controlled seizures. He has not had any seizures in greater than 10 years.  He remains on Carbatrol, he tolerates the medication well.  He currently is not on vitamin D supplementation, he has been taking this in the past.  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  He recently had a prostate biopsy, this was negative, he was felt to have benign prostatic hypertrophy.  He returns to this office for an evaluation.  Past Medical History:  Diagnosis Date   BPH (benign prostatic hyperplasia)    Glaucoma    High blood pressure    Seizures ([facility])     Past Surgical History:  Procedure Laterality Date   HERNIA REPAIR      Family History  Problem Relation Age of Onset   Colon cancer Father    Diabetes Sister     Social history:  reports that he has never smoked. He has never used smokeless tobacco. He reports that he does not drink alcohol and does not use drugs.   No Known Allergies  Medications:  Prior to Admission medications   Medication Sig Start Date End Date Taking? Authorizing Provider  amLODipine-benazepril (LOTREL) 10-20 MG per capsule 1 capsule daily. 01/24/13  Yes [provider]  CARBATROL 300 MG 12 hr capsule TAKE 1 CAPSULE BY MOUTH  TWICE DAILY 12/30/20  Yes Kathrynn Ducking, MD  COMBIGAN 0.2-0.5 % ophthalmic solution  05/07/15  Yes [provider]  dorzolamide-timolol (COSOPT) 22.3-6.8 MG/ML ophthalmic solution 1 drop 2 (two) times daily.   Yes [provider]  hydrochlorothiazide (HYDRODIURIL) 25 MG tablet Take 25 mg by mouth daily.   Yes [provider]  ibuprofen (ADVIL) 800 MG tablet Take 1 tablet (800 mg total) by mouth every 8 (eight) hours as needed. 07/21/20  Yes Newt Minion, MD  predniSONE (DELTASONE) 10 MG tablet Take 1 tablet (10  mg total) by mouth daily with breakfast. 07/21/20  Yes Newt Minion, MD  sildenafil (VIAGRA) 100 MG tablet Take 100 mg by mouth daily as needed. 10/10/19  Yes [provider]  Travoprost, BAK Free, (TRAVATAN) 0.004 % SOLN ophthalmic solution 1 drop at bedtime.   Yes [provider]    ROS:  Out of a complete 14 system review of symptoms, the patient complains only of the following symptoms, and all other reviewed systems are negative.  History of seizures  Blood pressure 116/64, pulse 79, height 6\' 1"  (1.854 m), weight 223 lb 8 oz (101.4 kg), SpO2 96 %.  Physical Exam  General: The patient is alert and cooperative at the time of the examination.  Skin: No significant peripheral edema is noted.   Neurologic Exam  Mental status: The patient is alert and oriented x 3 at the time of the examination. The patient has apparent normal recent and remote memory, with an apparently normal attention span and concentration ability.   Cranial nerves: Facial symmetry is present. Speech is normal, no aphasia or dysarthria is noted. Extraocular movements are full. Visual fields are full.  Motor: The patient has good strength in all 4 extremities.  Sensory examination: Soft touch sensation is symmetric on the face, arms, and legs.  Coordination: The patient has good finger-nose-finger and heel-to-shin bilaterally.  Gait and station: The patient has a  normal gait. Tandem gait is normal. Romberg is negative. No drift is seen.  Reflexes: Deep tendon reflexes are symmetric.   Assessment/Plan:  1.  History of seizures  The patient is doing well at this point.  He will continue the Carbatrol, he will have blood work today.  He will follow-up in 1 year.  In the future, he can be followed through Dr. April Manson.  Jill Alexanders MD 02/02/2021 9:46 AM  [facility] [address] [place], [place] [address]  Phone [phone] Fax [phone]

## 2021-02-03 LAB — COMPREHENSIVE METABOLIC PANEL
ALT: 25 IU/L (ref 0–44)
AST: 24 IU/L (ref 0–40)
Albumin/Globulin Ratio: 1.8 (ref 1.2–2.2)
Albumin: 4.5 g/dL (ref 3.8–4.8)
Alkaline Phosphatase: 73 IU/L (ref 44–121)
BUN/Creatinine Ratio: 13 (ref 10–24)
BUN: 16 mg/dL (ref 8–27)
Bilirubin Total: 0.3 mg/dL (ref 0.0–1.2)
CO2: 23 mmol/L (ref 20–29)
Calcium: 9.1 mg/dL (ref 8.6–10.2)
Chloride: 104 mmol/L (ref 96–106)
Creatinine, Ser: 1.24 mg/dL (ref 0.76–1.27)
Globulin, Total: 2.5 g/dL (ref 1.5–4.5)
Glucose: 119 mg/dL — ABNORMAL HIGH (ref 70–99)
Potassium: 3.6 mmol/L (ref 3.5–5.2)
Sodium: 142 mmol/L (ref 134–144)
Total Protein: 7 g/dL (ref 6.0–8.5)
eGFR: 65 mL/min/{1.73_m2} (ref >59)

## 2021-02-03 LAB — CBC WITH DIFFERENTIAL/PLATELET
Basophils Absolute: 0 10*3/uL (ref 0.0–0.2)
Basos: 1 %
EOS (ABSOLUTE): 0.1 10*3/uL (ref 0.0–0.4)
Eos: 2 %
Hematocrit: 40.8 % (ref 37.5–51.0)
Hemoglobin: 14.2 g/dL (ref 13.0–17.7)
Immature Grans (Abs): 0 10*3/uL (ref 0.0–0.1)
Immature Granulocytes: 0 %
Lymphocytes Absolute: 1.6 10*3/uL (ref 0.7–3.1)
Lymphs: 27 %
MCH: 32.1 pg (ref 26.6–33.0)
MCHC: 34.8 g/dL (ref 31.5–35.7)
MCV: 92 fL (ref 79–97)
Monocytes Absolute: 0.5 10*3/uL (ref 0.1–0.9)
Monocytes: 9 %
Neutrophils Absolute: 3.7 10*3/uL (ref 1.4–7.0)
Neutrophils: 61 %
Platelets: 212 10*3/uL (ref 150–450)
RBC: 4.43 x10E6/uL (ref 4.14–5.80)
RDW: 13.2 % (ref 11.6–15.4)
WBC: 6 10*3/uL (ref 3.4–10.8)

## 2021-02-03 LAB — CARBAMAZEPINE LEVEL, TOTAL: Carbamazepine (Tegretol), S: 12.2 ug/mL (ref 4.0–12.0)

## 2021-07-29 ENCOUNTER — Other Ambulatory Visit: Payer: Self-pay | Admitting: Orthopedic Surgery

## 2021-08-10 ENCOUNTER — Other Ambulatory Visit: Payer: Self-pay | Admitting: Orthopedic Surgery

## 2021-08-17 ENCOUNTER — Telehealth: Payer: Self-pay | Admitting: Orthopedic Surgery

## 2021-08-17 ENCOUNTER — Other Ambulatory Visit: Payer: Self-pay | Admitting: Orthopedic Surgery

## 2021-08-17 NOTE — Telephone Encounter (Signed)
Pt called for a refill on ibuprofen  ?

## 2021-08-17 NOTE — Telephone Encounter (Signed)
I called pt and advised that he has not been in the office in a year so he would need follow up in the office. Pt states that he takes for minor pain. Suggest he may want to follow up with his PCP. Advised to call if he would like to be seen. Happy to make appt.  ?

## 2022-02-08 ENCOUNTER — Ambulatory Visit: Payer: 59 | Admitting: Adult Health

## 2022-03-09 ENCOUNTER — Other Ambulatory Visit: Payer: Self-pay

## 2022-03-09 MED ORDER — CARBATROL 300 MG PO CP12: 300.0000 mg | ORAL_CAPSULE | Freq: Two times a day (BID) | ORAL | 1 refills | Status: DC

## 2022-03-09 NOTE — Progress Notes (Signed)
Rx sent. No further refills will be sent until appointment on 08/03/2022.

## 2022-06-29 ENCOUNTER — Other Ambulatory Visit: Payer: Self-pay | Admitting: *Deleted

## 2022-06-29 MED ORDER — CARBATROL 300 MG PO CP12: 300.0000 mg | ORAL_CAPSULE | Freq: Two times a day (BID) | ORAL | 0 refills | Status: DC

## 2022-06-29 NOTE — Telephone Encounter (Signed)
Received refill request from [facility] mail order pharmacy for Carbatrol capsule. Patient will be seeing MM NP next month for the first time. Last visit was with Dr Jannifer Franklin who stated patient can be followed by Dr April Manson. Rx request sent to Dr April Manson.   Last visit with Dr Jannifer Franklin:

## 2022-08-23 ENCOUNTER — Ambulatory Visit (INDEPENDENT_AMBULATORY_CARE_PROVIDER_SITE_OTHER): Payer: Medicare Other | Admitting: Adult Health

## 2022-08-23 ENCOUNTER — Encounter: Payer: Self-pay | Admitting: Adult Health

## 2022-08-23 VITALS — BP 118/67 | HR 55 | Ht 74.0 in | Wt 205.0 lb

## 2022-08-23 DIAGNOSIS — Z5181 Encounter for therapeutic drug level monitoring: Secondary | ICD-10-CM | POA: Diagnosis not present

## 2022-08-23 DIAGNOSIS — G40209 Localization-related (focal) (partial) symptomatic epilepsy and epileptic syndromes with complex partial seizures, not intractable, without status epilepticus: Secondary | ICD-10-CM

## 2022-08-23 MED ORDER — CARBATROL 300 MG PO CP12: 300.0000 mg | ORAL_CAPSULE | Freq: Two times a day (BID) | ORAL | 3 refills | Status: DC

## 2022-08-23 NOTE — Progress Notes (Signed)
PATIENT: Darryl Fletcher DOB: January 27, 1956  REASON FOR VISIT: follow up HISTORY FROM: patient PRIMARY NEUROLOGIST: Dr. Teresa Coombs  Chief Complaint  Patient presents with   Follow-up    Pt in 8  Pt here for seizure f/u Pt states hasn't used CPAP more than year. No seizure since last office visit      HISTORY OF PRESENT ILLNESS: Today 08/23/22  Darryl Fletcher is a 67 y.o. male who has been followed in this office for Seizures. Returns today for follow-up.  Remains on Carbatrol 300 mg twice a day.  He denies any seizure events.  Continues to operate a motor vehicle without difficulty.  Denies any new issues.  He has been followed in this office by Dr. Anne Hahn and Margie Ege.  Has not used his CPAP in over a year.  He returns today for an evaluation.  HISTORY Darryl Fletcher is a 67 year old left-handed black male with a history of well-controlled seizures. He has not had any seizures in greater than 10 years.  He remains on Carbatrol, he tolerates the medication well.  He currently is not on vitamin D supplementation, he has been taking this in the past.  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  He recently had a prostate biopsy, this was negative, he was felt to have benign prostatic hypertrophy.  He returns to this office for an evaluation.   REVIEW OF SYSTEMS: Out of a complete 14 system review of symptoms, the patient complains only of the following symptoms, and all other reviewed systems are negative.  ALLERGIES: No Known Allergies  HOME MEDICATIONS: Outpatient Medications Prior to Visit  Medication Sig Dispense Refill   amLODipine-benazepril (LOTREL) 10-20 MG per capsule 1 capsule daily.     CARBATROL 300 MG 12 hr capsule Take 1 capsule (300 mg total) by mouth 2 (two) times daily. 180 capsule 0   COMBIGAN 0.2-0.5 % ophthalmic solution      dorzolamide-timolol (COSOPT) 22.3-6.8 MG/ML ophthalmic solution 1 drop 2 (two) times daily.     hydrochlorothiazide  (HYDRODIURIL) 25 MG tablet Take 25 mg by mouth daily.     ibuprofen (ADVIL) 800 MG tablet TAKE 1 TABLET BY MOUTH EVERY 8 HOURS AS NEEDED 60 tablet 3   sildenafil (VIAGRA) 100 MG tablet Take 100 mg by mouth daily as needed.     Travoprost, BAK Free, (TRAVATAN) 0.004 % SOLN ophthalmic solution 1 drop at bedtime.     No facility-administered medications prior to visit.    PAST MEDICAL HISTORY: Past Medical History:  Diagnosis Date   BPH (benign prostatic hyperplasia)    Glaucoma    High blood pressure    Seizures (HCC)     PAST SURGICAL HISTORY: Past Surgical History:  Procedure Laterality Date   HERNIA REPAIR      FAMILY HISTORY: Family History  Problem Relation Age of Onset   Colon cancer Father    Diabetes Sister    Sleep apnea Neg Hx    Seizures Neg Hx     SOCIAL HISTORY: Social History   Socioeconomic History   Marital status: Married    Spouse name: Byrd Hesselbach   Number of children: 3   Years of education: 12   Highest education level: Not on file  Occupational History    Employer: [employer]  Tobacco Use   Smoking status: Never   Smokeless tobacco: Never  Substance and Sexual Activity   Alcohol use: No    Alcohol/week: 0.0  standard drinks of alcohol   Drug use: No   Sexual activity: Not on file  Other Topics Concern   Not on file  Social History Narrative   Patient is married and lives at home with his wife and    Patient has three children.   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   Patient has a high school education.   Patient drinks caffeine- 3-4 times per week.      Social Determinants of Health   Financial Resource Strain: Not on file  Food Insecurity: Not on file  Transportation Needs: Not on file  Physical Activity: Not on file  Stress: Not on file  Social Connections: Not on file  Intimate Partner Violence: Not on file      PHYSICAL EXAM  Vitals:   08/23/22 1038  BP: 118/67  Pulse: (!) 55  Weight: 205 lb (93  kg)  Height: 6\' 2"  (1.88 m)   Body mass index is 26.32 kg/m.  Generalized: Well developed, in no acute distress   Neurological examination  Mentation: Alert oriented to time, place, history taking. Follows all commands speech and language fluent Cranial nerve II-XII: Pupils were equal round reactive to light. Extraocular movements were full, visual field were full on confrontational test. Facial sensation and strength were normal. Head turning and shoulder shrug  were normal and symmetric. Motor: The motor testing reveals 5 over 5 strength of all 4 extremities. Good symmetric motor tone is noted throughout.  Sensory: Sensory testing is intact to soft touch on all 4 extremities. No evidence of extinction is noted.  Coordination: Cerebellar testing reveals good finger-nose-finger and heel-to-shin bilaterally.  Gait and station: Gait is normal. Tandem gait is normal. Romberg is negative. No drift is seen.  Reflexes: Deep tendon reflexes are symmetric and normal bilaterally.   DIAGNOSTIC DATA (LABS, IMAGING, TESTING) - I reviewed patient records, labs, notes, testing and imaging myself where available.  Lab Results  Component Value Date   WBC 6.0 02/02/2021   HGB 14.2 02/02/2021   HCT 40.8 02/02/2021   MCV 92 02/02/2021   PLT 212 02/02/2021      Component Value Date/Time   NA 142 02/02/2021 1010   K 3.6 02/02/2021 1010   CL 104 02/02/2021 1010   CO2 23 02/02/2021 1010   GLUCOSE 119 (H) 02/02/2021 1010   GLUCOSE 152 (H) 03/03/2018 0229   BUN 16 02/02/2021 1010   CREATININE 1.24 02/02/2021 1010   CALCIUM 9.1 02/02/2021 1010   PROT 7.0 02/02/2021 1010   ALBUMIN 4.5 02/02/2021 1010   AST 24 02/02/2021 1010   ALT 25 02/02/2021 1010   ALKPHOS 73 02/02/2021 1010   BILITOT 0.3 02/02/2021 1010   GFRNONAA 55 (L) 02/04/2020 0805   GFRAA 64 02/04/2020 0805       ASSESSMENT AND PLAN 67 y.o. year old male  has a past medical history of BPH (benign prostatic hyperplasia), Glaucoma,  High blood pressure, and Seizures (HCC). here with:  Seizures  Continue Carbatrol 300 mg twice a day Blood work today Advised if he has any  seizure events he should let us know Also discussed treatment of sleep apnea.  Right now he deferred but advised that we should repeat a home sleep test if he decides he would like his sleep apnea treated in the future FU in 1 year or sooner if needed     Butch Penny, MSN, NP-C 08/23/2022, 10:40 AM [facility] [address]  [place], [place] [address] [phone]

## 2022-08-24 LAB — CBC WITH DIFFERENTIAL/PLATELET
Basophils Absolute: 0 10*3/uL (ref 0.0–0.2)
Basos: 1 %
EOS (ABSOLUTE): 0.1 10*3/uL (ref 0.0–0.4)
Eos: 2 %
Hematocrit: 39.2 % (ref 37.5–51.0)
Hemoglobin: 13.3 g/dL (ref 13.0–17.7)
Immature Grans (Abs): 0 10*3/uL (ref 0.0–0.1)
Immature Granulocytes: 0 %
Lymphocytes Absolute: 1.2 10*3/uL (ref 0.7–3.1)
Lymphs: 27 %
MCH: 31.9 pg (ref 26.6–33.0)
MCHC: 33.9 g/dL (ref 31.5–35.7)
MCV: 94 fL (ref 79–97)
Monocytes Absolute: 0.4 10*3/uL (ref 0.1–0.9)
Monocytes: 9 %
Neutrophils Absolute: 2.8 10*3/uL (ref 1.4–7.0)
Neutrophils: 61 %
Platelets: 196 10*3/uL (ref 150–450)
RBC: 4.17 x10E6/uL (ref 4.14–5.80)
RDW: 13.4 % (ref 11.6–15.4)
WBC: 4.6 10*3/uL (ref 3.4–10.8)

## 2022-08-24 LAB — COMPREHENSIVE METABOLIC PANEL
ALT: 18 IU/L (ref 0–44)
AST: 22 IU/L (ref 0–40)
Albumin/Globulin Ratio: 1.7 (ref 1.2–2.2)
Albumin: 4.6 g/dL (ref 3.9–4.9)
Alkaline Phosphatase: 72 IU/L (ref 44–121)
BUN/Creatinine Ratio: 12 (ref 10–24)
BUN: 13 mg/dL (ref 8–27)
Bilirubin Total: 0.5 mg/dL (ref 0.0–1.2)
CO2: 24 mmol/L (ref 20–29)
Calcium: 9.6 mg/dL (ref 8.6–10.2)
Chloride: 102 mmol/L (ref 96–106)
Creatinine, Ser: 1.13 mg/dL (ref 0.76–1.27)
Globulin, Total: 2.7 g/dL (ref 1.5–4.5)
Glucose: 95 mg/dL (ref 70–99)
Potassium: 3.9 mmol/L (ref 3.5–5.2)
Sodium: 142 mmol/L (ref 134–144)
Total Protein: 7.3 g/dL (ref 6.0–8.5)
eGFR: 72 mL/min/{1.73_m2} (ref >59)

## 2022-08-24 LAB — CARBAMAZEPINE LEVEL, TOTAL: Carbamazepine (Tegretol), S: 9 ug/mL (ref 4.0–12.0)

## 2022-09-24 ENCOUNTER — Other Ambulatory Visit: Payer: Self-pay | Admitting: Orthopedic Surgery

## 2022-12-09 ENCOUNTER — Other Ambulatory Visit (INDEPENDENT_AMBULATORY_CARE_PROVIDER_SITE_OTHER): Payer: Medicare Other

## 2022-12-09 ENCOUNTER — Ambulatory Visit (INDEPENDENT_AMBULATORY_CARE_PROVIDER_SITE_OTHER): Payer: Medicare Other | Admitting: Orthopedic Surgery

## 2022-12-09 DIAGNOSIS — M65342 Trigger finger, left ring finger: Secondary | ICD-10-CM

## 2022-12-09 DIAGNOSIS — M79642 Pain in left hand: Secondary | ICD-10-CM | POA: Diagnosis not present

## 2022-12-09 MED ORDER — LIDOCAINE HCL 1 % IJ SOLN
1.0000 mL | INTRAMUSCULAR | Status: AC | PRN
  Administered 2022-12-09: 1 mL

## 2022-12-09 MED ORDER — BETAMETHASONE SOD PHOS & ACET 6 (3-3) MG/ML IJ SUSP
6.0000 mg | INTRAMUSCULAR | Status: AC | PRN
  Administered 2022-12-09: 6 mg via INTRA_ARTICULAR

## 2022-12-09 NOTE — Progress Notes (Signed)
Darryl Fletcher - 67 y.o. male MRN [id number]  Date of birth: 04-Jan-1956  Office Visit Note: Visit Date: 12/09/2022 PCP: Deatra James, MD Referred by: Deatra James, MD  Subjective: No chief complaint on file.  HPI: Darryl Fletcher is a pleasant 67 y.o. male who presents today for evaluation of ongoing clicking and locking of the left ring finger that has been present for multiple months.  He is noticing significant locking often requiring manual correction now on a regular basis.  No prior history of trigger fingers.  He does work in a [employer] and does heavy grip activity for a living.  He is left-hand dominant.  Visit Reason: left ring finger Hand dominance: left Occupation: Company secretary Diabetic: No Heart/Lung History: none Blood Thinners: none  Prior Testing/EMG: none Injections (Date): none Treatments: ibuprofen  Prior Surgery: none    *trigger finger*  Pertinent ROS were reviewed with the patient and found to be negative unless otherwise specified above in HPI.   Assessment & Plan: Visit Diagnoses:  1. Pain in left hand     Plan: Extensive discussion was had the patient today regarding his left ring finger trigger digit.  I reviewed the etiology and pathophysiology of this condition.  We discussed treatment options ranging from conservative to surgical.  From a conservative standpoint, we discussed range of motion exercises, splinting, and potential injection.  Given the severity of his symptoms, I do feel he is indicated for cortisone injection to the left ring finger A1 pulley for symptom relief.  We did discuss the efficacy of the injection as well as possible recurrence or persistence of his condition.  Should his symptoms remain refractory to conservative care, he would be an appropriate candidate for left ring finger trigger digit release in the future.  Cortisone injection provided today to the left ring finger A1 pulley for symptom relief.  He will return in 6  weeks for a recheck.  Follow-up: Return in about 6 weeks (around 01/20/2023).   Meds & Orders: No orders of the defined types were placed in this encounter.   Orders Placed This Encounter  Procedures   XR Hand Complete Left     Procedures: Hand/UE Inj: L ring A1 for trigger finger on 12/09/2022 9:11 AM Indications: pain Details: 25 G needle, volar approach Medications: 1 mL lidocaine 1 %; 6 mg betamethasone acetate-betamethasone sodium phosphate 6 (3-3) MG/ML Consent was given by the patient. Patient was prepped and draped in the usual sterile fashion.          Clinical History: No specialty comments available.  He reports that he has never smoked. He has never used smokeless tobacco. No results for input(s): "HGBA1C", "LABURIC" in the last 8760 hours.  Objective:   Vital Signs: There were no vitals taken for this visit.  Physical Exam  Gen: Well-appearing, in no acute distress; non-toxic CV: Regular Rate. Well-perfused. Warm.  Resp: Breathing unlabored on room air; no wheezing. Psych: Fluid speech in conversation; appropriate affect; normal thought process Neuro: Sensation intact throughout. No gross coordination deficits.   Ortho Exam Left hand: - Tenderness over the A1 pulley of the ring finger - Notable clicking and locking with deep flexion of the ring finger, requiring manual correction - Hand is warm well-perfused, sensation intact distally.  Imaging: XR Hand Complete Left  Result Date: 12/09/2022 X-rays of the left hand, multiple views were completed today X-rays demonstrate likely old healed fracture of the left proximal phalanx at the level  of the MCP joint.  There is notable degenerative change of the small joints particular at the DIPs diffusely.   Past Medical/Family/Surgical/Social History: Medications & Allergies reviewed per EMR, new medications updated. 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    Snoring 02/08/2018   Sleep related choking sensation 02/08/2018   OSA on CPAP 02/08/2018   Therapeutic drug monitoring 07/25/2017   Apnea, sleep 07/25/2017   Partial epilepsy with impairment of consciousness (HCC) 04/09/2013   Past Medical History:  Diagnosis Date   BPH (benign prostatic hyperplasia)    Glaucoma    High blood pressure    Seizures (HCC)    Family History  Problem Relation Age of Onset   Colon cancer Father    Diabetes Sister    Sleep apnea Neg Hx    Seizures Neg Hx    Past Surgical History:  Procedure Laterality Date   HERNIA REPAIR     Social History   Occupational History    Employer: [employer]  Tobacco Use   Smoking status: Never   Smokeless tobacco: Never  Substance and Sexual Activity   Alcohol use: No    Alcohol/week: 0.0 standard drinks of alcohol   Drug use: No   Sexual activity: Not on file    Darrious Youman Trevor Mace, M.D. [facility] 9:10 AM

## 2023-01-10 ENCOUNTER — Ambulatory Visit (INDEPENDENT_AMBULATORY_CARE_PROVIDER_SITE_OTHER): Payer: Medicare Other | Admitting: Orthopedic Surgery

## 2023-01-10 DIAGNOSIS — M65342 Trigger finger, left ring finger: Secondary | ICD-10-CM | POA: Diagnosis not present

## 2023-01-10 NOTE — Progress Notes (Signed)
QWANELL PELEGRIN - 67 y.o. male MRN [id number]  Date of birth: May 06, 1955  Office Visit Note: Visit Date: 01/10/2023 PCP: Deatra James, MD Referred by: Deatra James, MD  Subjective: Chief Complaint  Patient presents with   Left Hand - Follow-up   HPI: NAVEN SHAEFER is a pleasant 67 y.o. male who presents today for follow-up for left ring finger trigger digit that was managed conservatively with injection at his most recent visit approximate 6 weeks prior.  States that the symptoms have improved significantly, triggering has resolved.   Pertinent ROS were reviewed with the patient and found to be negative unless otherwise specified above in HPI.   Assessment & Plan: Visit Diagnoses:  No diagnosis found.   Plan: I am pleased to see that his triggering has resolved with the conservative methods.  I did once again explain the possibility for recurrence of trigger digit in the future, he is welcome return to me should he notice recurrent symptoms.  Follow-up: No follow-ups on file.   Meds & Orders: No orders of the defined types were placed in this encounter.   No orders of the defined types were placed in this encounter.    Procedures: No procedures performed      Clinical History: No specialty comments available.  He reports that he has never smoked. He has never used smokeless tobacco. No results for input(s): "HGBA1C", "LABURIC" in the last 8760 hours.  Objective:   Vital Signs: There were no vitals taken for this visit.  Physical Exam  Gen: Well-appearing, in no acute distress; non-toxic CV: Regular Rate. Well-perfused. Warm.  Resp: Breathing unlabored on room air; no wheezing. Psych: Fluid speech in conversation; appropriate affect; normal thought process Neuro: Sensation intact throughout. No gross coordination deficits.   Ortho Exam Left hand: - No significant tenderness over the A1 pulley of the ring finger - No clicking locking appreciated with deep flexion  of the ring finger - Hand is warm well-perfused, sensation intact distally.  Imaging: No results found.  Past Medical/Family/Surgical/Social History: Medications & Allergies reviewed per EMR, new medications updated. 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   Snoring 02/08/2018   Sleep related choking sensation 02/08/2018   OSA on CPAP 02/08/2018   Therapeutic drug monitoring 07/25/2017   Apnea, sleep 07/25/2017   Partial epilepsy with impairment of consciousness (HCC) 04/09/2013   Past Medical History:  Diagnosis Date   BPH (benign prostatic hyperplasia)    Glaucoma    High blood pressure    Seizures (HCC)    Family History  Problem Relation Age of Onset   Colon cancer Father    Diabetes Sister    Sleep apnea Neg Hx    Seizures Neg Hx    Past Surgical History:  Procedure Laterality Date   HERNIA REPAIR     Social History   Occupational History    Employer: [employer]  Tobacco Use   Smoking status: Never   Smokeless tobacco: Never  Substance and Sexual Activity   Alcohol use: No    Alcohol/week: 0.0 standard drinks of alcohol   Drug use: No   Sexual activity: Not on file    Moyses Pavey Trevor Mace, M.D. [facility] 3:26 PM

## 2023-01-17 ENCOUNTER — Ambulatory Visit: Payer: Medicare Other | Admitting: Orthopedic Surgery

## 2023-02-24 ENCOUNTER — Other Ambulatory Visit: Payer: Self-pay | Admitting: Urology

## 2023-02-24 DIAGNOSIS — R972 Elevated prostate specific antigen [PSA]: Secondary | ICD-10-CM

## 2023-04-10 ENCOUNTER — Ambulatory Visit
Admission: RE | Admit: 2023-04-10 | Discharge: 2023-04-10 | Disposition: A | Discharge status: Home / Self Care | Payer: Medicare Other | Source: Ambulatory Visit | Attending: Urology | Admitting: Urology

## 2023-04-10 DIAGNOSIS — R972 Elevated prostate specific antigen [PSA]: Secondary | ICD-10-CM

## 2023-05-24 ENCOUNTER — Other Ambulatory Visit: Payer: Self-pay | Admitting: Orthopedic Surgery

## 2023-05-26 ENCOUNTER — Ambulatory Visit
Admission: RE | Admit: 2023-05-26 | Discharge: 2023-05-26 | Disposition: A | Discharge status: Home / Self Care | Payer: Medicare Other | Source: Ambulatory Visit | Attending: Urology | Admitting: Urology

## 2023-05-26 MED ORDER — GADOPICLENOL 0.5 MMOL/ML IV SOLN
10.0000 mL | Freq: Once | INTRAVENOUS | Status: AC | PRN
  Administered 2023-05-26: 10 mL via INTRAVENOUS

## 2023-05-28 HISTORY — PX: TRIGGER FINGER RELEASE: SHX641

## 2023-05-30 ENCOUNTER — Ambulatory Visit (INDEPENDENT_AMBULATORY_CARE_PROVIDER_SITE_OTHER): Payer: Medicare Other | Admitting: Orthopedic Surgery

## 2023-05-30 ENCOUNTER — Other Ambulatory Visit (INDEPENDENT_AMBULATORY_CARE_PROVIDER_SITE_OTHER): Payer: Medicare Other

## 2023-05-30 DIAGNOSIS — M65341 Trigger finger, right ring finger: Secondary | ICD-10-CM | POA: Diagnosis not present

## 2023-05-30 DIAGNOSIS — M65342 Trigger finger, left ring finger: Secondary | ICD-10-CM | POA: Diagnosis not present

## 2023-05-30 NOTE — Progress Notes (Signed)
Darryl Fletcher - 68 y.o. male MRN [id number]  Date of birth: 1955/09/04  Office Visit Note: Visit Date: 05/30/2023 PCP: Deatra James, MD Referred by: Deatra James, MD  Subjective: No chief complaint on file.  HPI: Darryl Fletcher is a pleasant 68 y.o. male who presents today for follow-up for left ring finger trigger digit that was managed conservatively with injection in August of last year.  He states that the triggering has since resumed to the ring finger on the left side, often requiring manual correction.  Is also describing new onset right ring finger triggering as well.   Pertinent ROS were reviewed with the patient and found to be negative unless otherwise specified above in HPI.   Assessment & Plan: Visit Diagnoses:  1. Trigger ring finger of right hand      Plan: Extensive discussion was had with the patient today regarding his ongoing left ring trigger digit that is becoming refractory to conservative care as well as the new onset right ring finger trigger digit.  We once again discussed the etiology and pathophysiology of stenosing tenosynovitis.  We discussed conservative versus surgical treatment modalities.  From a conservative standpoint, we discussed activity modification, splinting, therapy and injections.  From a surgical standpoint, we discussed the possibility for trigger digit release as well as all risk and benefits associated.  For the left ring trigger digit, given that this has become refractory to conservative care with previous activity modification and injection, he is indicated for left ring finger trigger digit release under local anesthesia.  This will be performed later this week per his request.  For the right ring finger, given that he has not trialed conservative treatments, we will begin with trigger splinting, patient is appropriate candidate for cortisone injection to the right ring finger finger A1 pulley for symptom relief in the future as well.      Follow-up: No follow-ups on file.   Meds & Orders: No orders of the defined types were placed in this encounter.   Orders Placed This Encounter  Procedures   XR Hand Complete Right     Procedures: No procedures performed      Clinical History: No specialty comments available.  He reports that he has never smoked. He has never used smokeless tobacco. No results for input(s): "HGBA1C", "LABURIC" in the last 8760 hours.  Objective:   Vital Signs: There were no vitals taken for this visit.  Physical Exam  Gen: Well-appearing, in no acute distress; non-toxic CV: Regular Rate. Well-perfused. Warm.  Resp: Breathing unlabored on room air; no wheezing. Psych: Fluid speech in conversation; appropriate affect; normal thought process Neuro: Sensation intact throughout. No gross coordination deficits.   Ortho Exam Left hand: - Notable tenderness over the A1 pulley of the ring finger - Clicking seen with deep flexion of the ring finger, unable to reach distal palmar crease due to stiffness and pain - Hand is warm well-perfused, sensation intact distally.  Right hand: - Mild tenderness at the ring finger A1 pulley, small nodule associated - No significant active clicking or locking on examination today  Imaging: XR Hand Complete Right Result Date: 05/30/2023 X-rays of the right hand, multiple views were obtained today without significant bony abnormalities   Past Medical/Family/Surgical/Social History: Medications & Allergies reviewed per EMR, new medications updated. 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   Snoring 02/08/2018   Sleep related choking sensation 02/08/2018  OSA on CPAP 02/08/2018   Therapeutic drug monitoring 07/25/2017   Apnea, sleep 07/25/2017   Partial epilepsy with impairment of consciousness (HCC) 04/09/2013   Past Medical History:  Diagnosis Date   BPH (benign prostatic hyperplasia)     Glaucoma    High blood pressure    Seizures (HCC)    Family History  Problem Relation Age of Onset   Colon cancer Father    Diabetes Sister    Sleep apnea Neg Hx    Seizures Neg Hx    Past Surgical History:  Procedure Laterality Date   HERNIA REPAIR     Social History   Occupational History    Employer: [employer]  Tobacco Use   Smoking status: Never   Smokeless tobacco: Never  Substance and Sexual Activity   Alcohol use: No    Alcohol/week: 0.0 standard drinks of alcohol   Drug use: No   Sexual activity: Not on file    Ellis Koffler Trevor Mace, M.D. [facility] 9:16 AM

## 2023-06-02 DIAGNOSIS — M65341 Trigger finger, right ring finger: Secondary | ICD-10-CM | POA: Diagnosis not present

## 2023-06-15 ENCOUNTER — Ambulatory Visit (INDEPENDENT_AMBULATORY_CARE_PROVIDER_SITE_OTHER): Payer: Medicare Other | Admitting: Orthopedic Surgery

## 2023-06-15 DIAGNOSIS — M65342 Trigger finger, left ring finger: Secondary | ICD-10-CM

## 2023-06-15 NOTE — Progress Notes (Signed)
   [name] - 68 y.o. male MRN [id number]  Date of birth: Nov 18, 1955  Office Visit Note: Visit Date: 06/15/2023 PCP: Deatra James, MD Referred by: Deatra James, MD  Subjective:  HPI: Darryl Fletcher is a 68 y.o. male who presents today for follow up 2 weeks status post left ring finger trigger digit.  He is doing well postoperatively, range of motion improving nicely without residual clicking or locking.  Denies any numbness or tingling.  Pertinent ROS were reviewed with the patient and found to be negative unless otherwise specified above in HPI.   Assessment & Plan: Visit Diagnoses: No diagnosis found.  Plan: He has done quite well postoperatively.  Sutures removed today and Steri-Strips applied.  He can see occupational therapy for range of motion exercise with transition to home exercise program as needed.  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  I will plan on seeing him back in approxi-4 weeks to track his progress.  Work note was provided today.  Follow-up: No follow-ups on file.   Meds & Orders: No orders of the defined types were placed in this encounter.  No orders of the defined types were placed in this encounter.    Procedures: No procedures performed       Objective:   Vital Signs: There were no vitals taken for this visit.  Ortho Exam Left hand: - Well-healing incision at the base of the ring finger, skin is well-approximated, no erythema or drainage, sutures removed and Steri-Strips applied - Able to perform full digital range of motion without residual clicking or locking - Sensation intact distally, hand is warm well-perfused   Imaging: No results found.   Osher Oettinger Trevor Mace, M.D. [facility], Hand Surgery

## 2023-06-16 ENCOUNTER — Encounter: Payer: Medicare Other | Admitting: Orthopedic Surgery

## 2023-06-27 ENCOUNTER — Other Ambulatory Visit: Payer: Self-pay | Admitting: Orthopedic Surgery

## 2023-06-27 NOTE — Therapy (Signed)
 OUTPATIENT OCCUPATIONAL THERAPY ORTHO EVALUATION  Patient Name: Darryl Fletcher MRN: [id number] DOB:03-25-1956, 68 y.o., male Today's Date: 06/28/2023  PCP: Marcy Siren MD REFERRING PROVIDER:  Samuella Cota, MD    END OF SESSION:  OT End of Session - 06/28/23 1303     Visit Number 1    Number of Visits 1    Authorization Type [employer] Medicare    OT Start Time 1303    OT Stop Time 1333    OT Time Calculation (min) 30 min    Activity Tolerance Patient tolerated treatment well;No increased pain;Patient limited by fatigue;Patient limited by pain    Behavior During Therapy [facility] for tasks assessed/performed             Past Medical History:  Diagnosis Date   BPH (benign prostatic hyperplasia)    Glaucoma    High blood pressure    Seizures (HCC)    Past Surgical History:  Procedure Laterality Date   HERNIA REPAIR     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   Snoring 02/08/2018   Sleep related choking sensation 02/08/2018   OSA on CPAP 02/08/2018   Therapeutic drug monitoring 07/25/2017   Apnea, sleep 07/25/2017   Partial epilepsy with impairment of consciousness (HCC) 04/09/2013    ONSET DATE: Date of surgery: 06/02/2023  REFERRING DIAG: M57.846 (ICD-10-CM) - Trigger finger, left ring finger   THERAPY DIAG:  Stiffness of left hand, not elsewhere classified - Plan: Ot plan of care cert/re-cert  Trigger finger, left ring finger - Plan: Ot plan of care cert/re-cert  Localized edema - Plan: Ot plan of care cert/re-cert  Muscle weakness (generalized) - Plan: Ot plan of care cert/re-cert  Rationale for Evaluation and Treatment: Rehabilitation  SUBJECTIVE:   SUBJECTIVE STATEMENT: Now 3+ weeks status post left ring finger trigger finger release.  He states he had trigger finger for a few months starting Aug last year and injection was only temporary relief.  Now no pain at rest, or triggering, etc.    PERTINENT  HISTORY: Triggering started about 8 months ago, he had injection which was only mildly helpful and subsequently had surgery.    PRECAUTIONS: None  RED FLAGS: None   WEIGHT BEARING RESTRICTIONS: No  PAIN:  Are you having pain? No  FALLS: Has patient fallen in last 6 months? No  LIVING ENVIRONMENT: Lives with: lives with their family Lives in: House/apartment Has following equipment at home: None  PLOF: Independent  PATIENT GOALS: To get left dominant hand full function  NEXT MD VISIT: As needed  OBJECTIVE: (All objective assessments below are from initial evaluation on: 06/28/2023 unless otherwise specified.)    HAND DOMINANCE: Left handed  ADLs: Overall ADLs: States initial deficits after surgery but now he is "90% back to full function"   FUNCTIONAL OUTCOME MEASURES: Eval: Patient Specific Functional Scale: 9 (writing, using utensils, opening containers)  (Higher Score  =  Better Ability for the Selected Tasks)       UPPER EXTREMITY ROM     Shoulder to Wrist AROM Right eval Left eval  Wrist flexion 68 68  Wrist extension 72 65  (Blank rows = not tested)   Hand AROM Right eval Left eval  Full Fist Ability (or Gap to Distal Palmar Crease)  Full fist, but a bit loose in RF   Thumb Opposition  (Kapandji Scale)   10/10  Thumb MCP (0-60)    Thumb IP (0-80)  Thumb Radial Abduction Span     Thumb Palmar Abduction Span     Index MCP (0-90)     Index PIP (0-100)     Index DIP (0-70)      Long MCP (0-90)      Long PIP (0-100)      Long DIP (0-70)      Ring MCP (0-90)   0-  68  Ring PIP (0-100)   0-  87  Ring DIP (0-70)   0-  48  Little MCP (0-90)      Little PIP (0-100)      Little DIP (0-70)      (Blank rows = not tested)   UPPER EXTREMITY MMT:    Eval:  NT at eval due to recent and still healing injuries. Expected to improve with HEP and recommendations.   HAND FUNCTION: Eval: Observed weakness in affected left hand/arm, grossly 4-/5 MMT, but  specific gripping and resistance training contraindicated today. Expected to improve with HEP and recommendations.    COORDINATION: Eval: No significant coordination issues noted in the left hand or arm now  SENSATION: Eval:  Light touch intact today   EDEMA:   Eval:  Mildly swollen in left palm and hand today.  Expected to improve with HEP and recommendations.   COGNITION: Eval: Overall cognitive status: WFL for evaluation today   OBSERVATIONS:   Eval: Surgical site is clean and no overt signs of infection, no drainage, signs of dehiscence, etc.  Tenderness and swelling is within normal limits for post-op timeframe.     TODAY'S TREATMENT:  Post-evaluation treatment:   The patient was given safety information for managing post-op wound, avoid any strong gripping, push, pull, weight bearing or repetitive motion for the next 3-4 weeks.  They should not be doing painful activities.   After a month, they can progressively return to all light, normal activities. Sports and heavy weight lifting  should be withheld for a total of 3 months.   The patient was also educated (explanation and demonstration) on the following home exercise program including tolerable range of motion, gentle passive range of motion, scar care, progressive desensitization, prevention of soft tissue contractures, etc. The patient states understanding all directions and feels comfortable with doing this at home, self-management, and following up with the surgeon as needed/scheduled.   Exercises - Wrist Prayer Stretch  - 3-4 x daily - 3 reps - 15 seconds hold - Tendon Glides  - 3-4 x daily - 5 reps - 3 second hold - Full finger stretches   - 3-4 x daily - 3 reps - 15 second hold - Push your knuckles down, pull back hand to feel a stretch  - 3-4 x daily - 3 reps - 15 second hold  Patient Education - Scar Massage   PATIENT EDUCATION: Education details: See tx section above for details  Person educated:  Patient Education method: Verbal Instruction, Teach back, Handouts  Education comprehension: States and demonstrates understanding   HOME EXERCISE PROGRAM: Access Code: [email or web address] URL: [email or web address] Date: 06/28/2023 Prepared by: Fannie Knee   GOALS: Goals reviewed with patient? Yes   SHORT TERM GOALS: (STG required if POC>30 days) Target Date: 06/28/23  1.  Pt will demo/state understanding of initial HEP and therapist recommendations to improve pain levels, improve motions and ability and eventually return to normal activities.   Goal status: MET     ASSESSMENT:  CLINICAL IMPRESSION: Patient is a 68 y.o. male who was seen today for  occupational therapy evaluation for swelling, pain, weakness and decreased functional ability following left ring finger trigger finger release procedure. The patient is appropriate for OT rehab services and benefited from treatment today. The patient got copious education/treatment today for self-care, wound management, exercises and how to transition to normal activities in the next 3-5 weeks. The patient agrees that they can manage these recommendations independently, and should not need to return for follow up visits. The patient should follow up with the surgeon with any concerns, and could possibly return to therapy, if needed, with a new order.  The patient will discharge therapy treatment after this visit.     PERFORMANCE DEFICITS: in functional skills including ADLs, IADLs, coordination, dexterity, sensation, edema, ROM, strength, pain, fascial restrictions, flexibility, Fine motor control, body mechanics, endurance, decreased knowledge of precautions, wound, and UE functional use, cognitive skills including problem solving and safety awareness, and psychosocial skills including coping strategies, environmental adaptation, and habits.   IMPAIRMENTS: are limiting patient from ADLs, IADLs, rest and sleep, leisure, and social  participation.   COMORBIDITIES: may have co-morbidities  that affects occupational performance. Patient will benefit from skilled OT to address above impairments and improve overall function.  MODIFICATION OR ASSISTANCE TO COMPLETE EVALUATION: No modification of tasks or assist necessary to complete an evaluation.  OT OCCUPATIONAL PROFILE AND HISTORY: Problem focused assessment: Including review of records relating to presenting problem.  CLINICAL DECISION MAKING: LOW - limited treatment options, no task modification necessary  REHAB POTENTIAL: Excellent  EVALUATION COMPLEXITY: Low      PLAN:  OT FREQUENCY: one time visit  OT DURATION: 1 sessions  PLANNED INTERVENTIONS: self care/ADL training, therapeutic exercise, therapeutic activity, neuromuscular re-education, manual therapy, scar mobilization, passive range of motion, splinting, ultrasound, fluidotherapy, compression bandaging, moist heat, cryotherapy, contrast bath, patient/family education, energy conservation, coping strategies training, and Re-evaluation  RECOMMENDED OTHER SERVICES: none now   CONSULTED AND AGREED WITH PLAN OF CARE: Patient  PLAN FOR NEXT SESSION:   N/A   Fannie Knee, OTR/L, CHT 06/28/2023, 1:44 PM    Date of referral: 06/15/23 Referring provider: Dr Denese Killings Referring diagnosis? M65.342 (ICD-10-CM) - Trigger finger, left ring finger  Treatment diagnosis? (if different than referring diagnosis) M25.642  What was this (referring dx) caused by? Surgery (Type: Let hand ring finger trigger finger release)  Nature of Condition: Initial Onset (within last 3 months)   Laterality: Lt  Current Functional Measure Score: Other PSFS: 9/10  Objective measurements identify impairments when they are compared to normal values, the uninvolved extremity, and prior level of function.  [x]  Yes  []  No  Objective assessment of functional ability: No functional limitations   Briefly describe symptoms: mild  soreness and stiffness following surgery, he will manage with home program that was educated on today   How did symptoms start: pain, clicking, popping in left hand about 8 months ago eventually needing surgery   Average pain intensity:  Last 24 hours: 0/10  Past week: 0/10  How often does the pt experience symptoms? Intermittently  How much have the symptoms interfered with usual daily activities? A little bit  How has condition changed since care began at this facility? NA - initial visit  In general, how is the patients overall health? Excellent   BACK PAIN (STarT Back Screening Tool) No

## 2023-06-28 ENCOUNTER — Ambulatory Visit (INDEPENDENT_AMBULATORY_CARE_PROVIDER_SITE_OTHER): Payer: Medicare Other | Admitting: Rehabilitative and Restorative Service Providers"

## 2023-06-28 ENCOUNTER — Encounter: Payer: Self-pay | Admitting: Rehabilitative and Restorative Service Providers"

## 2023-06-28 DIAGNOSIS — M65342 Trigger finger, left ring finger: Secondary | ICD-10-CM

## 2023-06-28 DIAGNOSIS — M25642 Stiffness of left hand, not elsewhere classified: Secondary | ICD-10-CM

## 2023-06-28 DIAGNOSIS — M6281 Muscle weakness (generalized): Secondary | ICD-10-CM

## 2023-06-28 DIAGNOSIS — R600 Localized edema: Secondary | ICD-10-CM

## 2023-06-28 NOTE — Patient Instructions (Signed)
 Nate's Trigger Finger Release Recommendations    Do not to soak your wound for at least 1 week, or until it looks well healed. Wash hand or shower quickly, then dab dry gently. You can put a Vaseline or plain lotion on your closed wound to help the scar heal smoothly. (Don't leave it looking dry and crusty.)   Avoid any strong gripping, push, pull, weight bearing or repetitive motion for the next month.  (If it hurts very badly- don't do it!) After a month, you can progressively return to all normal, light activities. Sports and heavy weightlifting should be withheld for about 3 months.   Once your surgical wound is looking healed and closed (about 3 days after stitches are out) start touching/rubbing your scar and gently trying to shift the top layer of your skin. It's ok to do this gently over top of steri-strips. Do not remove steri-strips, but wait for them to fall off naturally. Rub, touch gently for 2-3 minutes, 3-4 x day.      If you have any tingling or itching/burning around your scar, just lightly rub/brush it with a cloth for 2-3 mins to calm your nerves.  Starting 1-2 weeks after surgery, do the following exercises 4 times a day, non-painfully, feeling light to medium tension.  (See exercise sheet) If you're sore- slow down!   If you're stiff- speed up!   In general, keep your hand moving, and don't let it heal stiffly.     If you are having any new problems, lingering stiffness, pain, signs of infection (redness, swelling, oozing puss, tenderness), contact your surgeon immediately.  He may send you to formal hand therapy.

## 2023-07-18 ENCOUNTER — Ambulatory Visit (INDEPENDENT_AMBULATORY_CARE_PROVIDER_SITE_OTHER): Payer: Medicare Other | Admitting: Orthopedic Surgery

## 2023-07-18 DIAGNOSIS — Z9889 Other specified postprocedural states: Secondary | ICD-10-CM

## 2023-07-18 NOTE — Progress Notes (Unsigned)
   Darryl Fletcher - 68 y.o. male MRN [id number]  Date of birth: [birth year]  Office Visit Note: Visit Date: 07/18/2023 PCP: Deatra James, MD Referred by: Deatra James, MD  Subjective:  HPI: Darryl Fletcher is a 68 y.o. male who presents today for follow up 6 weeks status post left ring finger trigger digit release.  Pertinent ROS were reviewed with the patient and found to be negative unless otherwise specified above in HPI.   Assessment & Plan: Visit Diagnoses: No diagnosis found.  Plan: ***  Follow-up: No follow-ups on file.   Meds & Orders: No orders of the defined types were placed in this encounter.  No orders of the defined types were placed in this encounter.    Procedures: No procedures performed       Objective:   Vital Signs: There were no vitals taken for this visit.  Ortho Exam ***  Imaging: No results found.   Darryl Fletcher, M.D. [facility], Hand Surgery

## 2023-08-24 ENCOUNTER — Ambulatory Visit (INDEPENDENT_AMBULATORY_CARE_PROVIDER_SITE_OTHER): Payer: Medicare Other | Admitting: Adult Health

## 2023-08-24 ENCOUNTER — Encounter: Payer: Self-pay | Admitting: Adult Health

## 2023-08-24 VITALS — BP 112/67 | HR 57 | Ht 74.0 in | Wt 208.4 lb

## 2023-08-24 DIAGNOSIS — G40209 Localization-related (focal) (partial) symptomatic epilepsy and epileptic syndromes with complex partial seizures, not intractable, without status epilepticus: Secondary | ICD-10-CM | POA: Diagnosis not present

## 2023-08-24 DIAGNOSIS — Z5181 Encounter for therapeutic drug level monitoring: Secondary | ICD-10-CM | POA: Diagnosis not present

## 2023-08-24 MED ORDER — CARBATROL 300 MG PO CP12
300.0000 mg | ORAL_CAPSULE | Freq: Two times a day (BID) | ORAL | 3 refills | Status: AC
Start: 2023-08-24 — End: ?

## 2023-08-24 NOTE — Patient Instructions (Signed)
Your Plan:  Continue Carbatrol Blood work today If your symptoms worsen or you develop new symptoms please let us know.   Thank you for coming to see [place] at [facility]. I hope we have been able to provide you high quality care today.  You may receive a patient satisfaction survey over the next few weeks. We would appreciate your feedback and comments so that we may continue to improve ourselves and the health of our patients.

## 2023-08-24 NOTE — Progress Notes (Signed)
 PATIENT: Darryl Fletcher DOB: Oct 15, 1955  REASON FOR VISIT: follow up HISTORY FROM: patient PRIMARY NEUROLOGIST: Dr. Samara Crest  Chief Complaint  Patient presents with   Room 9    Pt is here Alone. Pt states that things have been going very good since last appointment. Pt denies any new questions or concerns to discuss today. Pt states that he has a prostrate biopsy next Monday 08/29/2023.      HISTORY OF PRESENT ILLNESS: Today 08/24/23:  Darryl Fletcher is a 68 y.o. male with a history of seizures. Returns today for follow-up.  Denies any seizure events.  Remains on Carbatrol  300 mg twice a day.  Able to complete all ADLs independently.  Operates a Librarian, academic.  Denies any changes in mood or behavior.  No change in gait or balance.  Reports that he does have a prostrate biopsy next Monday.  Reports that his levels have been slowly increasing.  Although he had a biopsy a year ago and it was negative.     08/23/22: Darryl Fletcher is a 68 y.o. male who has been followed in this office for Seizures. Returns today for follow-up.  Remains on Carbatrol  300 mg twice a day.  He denies any seizure events.  Continues to operate a motor vehicle without difficulty.  Denies any new issues.  He has been followed in this office by Dr. Tilda Fogo and Jeanmarie Millet.  Has not used his CPAP in over a year.  He returns today for an evaluation.  HISTORY Mr. Falzon is a 68 year old left-handed black male with a history of well-controlled seizures. He has not had any seizures in greater than 10 years.  He remains on Carbatrol , he tolerates the medication well.  He currently is not on vitamin D supplementation, he has been taking this in the past.  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  He recently had a prostate biopsy, this was negative, he was felt to have benign prostatic hypertrophy.  He returns to this office for an evaluation.   REVIEW OF SYSTEMS: Out of a complete 14 system review of  symptoms, the patient complains only of the following symptoms, and all other reviewed systems are negative.  ALLERGIES: No Known Allergies  HOME MEDICATIONS: Outpatient Medications Prior to Visit  Medication Sig Dispense Refill   amLODipine-benazepril (LOTREL) 10-20 MG per capsule 1 capsule daily.     CARBATROL  300 MG 12 hr capsule Take 1 capsule (300 mg total) by mouth 2 (two) times daily. 180 capsule 3   hydrochlorothiazide (HYDRODIURIL) 25 MG tablet Take 25 mg by mouth daily.     ibuprofen  (ADVIL ) 800 MG tablet TAKE 1 TABLET BY MOUTH EVERY 8 HOURS AS NEEDED 60 tablet 3   Travoprost, BAK Free, (TRAVATAN) 0.004 % SOLN ophthalmic solution 1 drop at bedtime.     COMBIGAN 0.2-0.5 % ophthalmic solution  (Patient not taking: Reported on 08/24/2023)     dorzolamide-timolol (COSOPT) 22.3-6.8 MG/ML ophthalmic solution 1 drop 2 (two) times daily. (Patient not taking: Reported on 08/24/2023)     sildenafil (VIAGRA) 100 MG tablet Take 100 mg by mouth daily as needed. (Patient not taking: Reported on 08/24/2023)     No facility-administered medications prior to visit.    PAST MEDICAL HISTORY: Past Medical History:  Diagnosis Date   BPH (benign prostatic hyperplasia)    Glaucoma    High blood pressure    Seizures (HCC)     PAST SURGICAL HISTORY:  Past Surgical History:  Procedure Laterality Date   HERNIA REPAIR     TRIGGER FINGER RELEASE Left 05/2023    FAMILY HISTORY: Family History  Problem Relation Age of Onset   Colon cancer Father    Diabetes Sister    Sleep apnea Neg Hx    Seizures Neg Hx     SOCIAL HISTORY: Social History   Socioeconomic History   Marital status: Married    Spouse name: Shelagh Derrick   Number of children: 3   Years of education: 12   Highest education level: Not on file  Occupational History    Employer: [employer]  Tobacco Use   Smoking status: Never   Smokeless tobacco: Never  Substance and Sexual Activity   Alcohol use: No    Alcohol/week: 0.0  standard drinks of alcohol   Drug use: No   Sexual activity: Not on file  Other Topics Concern   Not on file  Social History Narrative   Patient is married and lives at home with his wife and    Patient has three children.   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   Patient has a high school education.   Patient drinks caffeine- 3-4 times per week.      Social Drivers of Corporate investment banker Strain: Not on file  Food Insecurity: Not on file  Transportation Needs: Not on file  Physical Activity: Not on file  Stress: Not on file  Social Connections: Not on file  Intimate Partner Violence: Not on file      PHYSICAL EXAM  Vitals:   08/24/23 1002  BP: 112/67  Pulse: (!) 57  Weight: 208 lb 6.4 oz (94.5 kg)  Height: 6\' 2"  (1.88 m)   Body mass index is 26.76 kg/m.  Generalized: Well developed, in no acute distress   Neurological examination  Mentation: Alert oriented to time, place, history taking. Follows all commands speech and language fluent Cranial nerve II-XII: Pupils were equal round reactive to light. Extraocular movements were full, visual field were full on confrontational test. Facial sensation and strength were normal. Head turning and shoulder shrug  were normal and symmetric. Motor: The motor testing reveals 5 over 5 strength of all 4 extremities. Good symmetric motor tone is noted throughout.  Sensory: Sensory testing is intact to soft touch on all 4 extremities. No evidence of extinction is noted.  Coordination: Cerebellar testing reveals good finger-nose-finger and heel-to-shin bilaterally.  Gait and station: Gait is normal.  Tandem gait is steady.  Romberg is slightly unsteady but negative Reflexes: Deep tendon reflexes are symmetric but depressed in the lower extremities  DIAGNOSTIC DATA (LABS, IMAGING, TESTING) - I reviewed patient records, labs, notes, testing and imaging myself where available.  Lab Results  Component Value  Date   WBC 4.6 08/23/2022   HGB 13.3 08/23/2022   HCT 39.2 08/23/2022   MCV 94 08/23/2022   PLT 196 08/23/2022      Component Value Date/Time   NA 142 08/23/2022 1101   K 3.9 08/23/2022 1101   CL 102 08/23/2022 1101   CO2 24 08/23/2022 1101   GLUCOSE 95 08/23/2022 1101   GLUCOSE 152 (H) 03/03/2018 0229   BUN 13 08/23/2022 1101   CREATININE 1.13 08/23/2022 1101   CALCIUM 9.6 08/23/2022 1101   PROT 7.3 08/23/2022 1101   ALBUMIN 4.6 08/23/2022 1101   AST 22 08/23/2022 1101   ALT 18 08/23/2022 1101   ALKPHOS 72 08/23/2022 1101  BILITOT 0.5 08/23/2022 1101   GFRNONAA 55 (L) 02/04/2020 0805   GFRAA 64 02/04/2020 0805       ASSESSMENT AND PLAN 68 y.o. year old male  has a past medical history of BPH (benign prostatic hyperplasia), Glaucoma, High blood pressure, and Seizures (HCC). here with:  Seizures  Continue Carbatrol  300 mg twice a day Blood work today-CBC, CMP, carbamazepine  level Advised if he has any seizure events he should let us  know FU in 1 year or sooner if needed     Clem Currier, MSN, NP-C 08/24/2023, 10:11 AM [facility] [address] [place], [place] [address] [phone]

## 2023-08-25 LAB — CBC WITH DIFFERENTIAL/PLATELET
Basophils Absolute: 0 10*3/uL (ref 0.0–0.2)
Basos: 1 %
EOS (ABSOLUTE): 0.1 10*3/uL (ref 0.0–0.4)
Eos: 1 %
Hematocrit: 39.4 % (ref 37.5–51.0)
Hemoglobin: 13.1 g/dL (ref 13.0–17.7)
Immature Grans (Abs): 0 10*3/uL (ref 0.0–0.1)
Immature Granulocytes: 0 %
Lymphocytes Absolute: 1.4 10*3/uL (ref 0.7–3.1)
Lymphs: 27 %
MCH: 32 pg (ref 26.6–33.0)
MCHC: 33.2 g/dL (ref 31.5–35.7)
MCV: 96 fL (ref 79–97)
Monocytes Absolute: 0.5 10*3/uL (ref 0.1–0.9)
Monocytes: 10 %
Neutrophils Absolute: 3.2 10*3/uL (ref 1.4–7.0)
Neutrophils: 61 %
Platelets: 188 10*3/uL (ref 150–450)
RBC: 4.1 x10E6/uL — ABNORMAL LOW (ref 4.14–5.80)
RDW: 13 % (ref 11.6–15.4)
WBC: 5.2 10*3/uL (ref 3.4–10.8)

## 2023-08-25 LAB — COMPREHENSIVE METABOLIC PANEL WITH GFR
ALT: 17 IU/L (ref 0–44)
AST: 17 IU/L (ref 0–40)
Albumin: 4.6 g/dL (ref 3.9–4.9)
Alkaline Phosphatase: 81 IU/L (ref 44–121)
BUN/Creatinine Ratio: 12 (ref 10–24)
BUN: 15 mg/dL (ref 8–27)
Bilirubin Total: 0.3 mg/dL (ref 0.0–1.2)
CO2: 25 mmol/L (ref 20–29)
Calcium: 9.4 mg/dL (ref 8.6–10.2)
Chloride: 102 mmol/L (ref 96–106)
Creatinine, Ser: 1.28 mg/dL — ABNORMAL HIGH (ref 0.76–1.27)
Globulin, Total: 2.4 g/dL (ref 1.5–4.5)
Glucose: 89 mg/dL (ref 70–99)
Potassium: 4.2 mmol/L (ref 3.5–5.2)
Sodium: 141 mmol/L (ref 134–144)
Total Protein: 7 g/dL (ref 6.0–8.5)
eGFR: 62 mL/min/{1.73_m2} (ref >59)

## 2023-08-25 LAB — CARBAMAZEPINE LEVEL, TOTAL: Carbamazepine (Tegretol), S: 15 ug/mL (ref 4.0–12.0)

## 2023-08-31 ENCOUNTER — Telehealth: Payer: Self-pay | Admitting: *Deleted

## 2023-08-31 ENCOUNTER — Other Ambulatory Visit: Payer: Self-pay | Admitting: *Deleted

## 2023-08-31 DIAGNOSIS — Z5181 Encounter for therapeutic drug level monitoring: Secondary | ICD-10-CM

## 2023-08-31 DIAGNOSIS — G40209 Localization-related (focal) (partial) symptomatic epilepsy and epileptic syndromes with complex partial seizures, not intractable, without status epilepticus: Secondary | ICD-10-CM

## 2023-08-31 NOTE — Telephone Encounter (Signed)
 Relayed results of labs to pt.  Repeat carbatrol  level as elevated, with trough level, will come in Monday 09-05-2023 at 0800.  His creatinine is elevated and will forward to pcp for her to review.  Pt aware of this and will see pcp upcoming appt.  Pt verbalized understanding and plan.

## 2023-08-31 NOTE — Telephone Encounter (Signed)
-----   Message from [facility] sent at 08/25/2023  2:49 PM EDT ----- Carbamazepine  level was increased. We need to recheck.  Creatinine is elevated- please send to PCP

## 2023-09-05 ENCOUNTER — Other Ambulatory Visit (INDEPENDENT_AMBULATORY_CARE_PROVIDER_SITE_OTHER)

## 2023-09-05 DIAGNOSIS — Z0289 Encounter for other administrative examinations: Secondary | ICD-10-CM

## 2023-09-05 DIAGNOSIS — G40209 Localization-related (focal) (partial) symptomatic epilepsy and epileptic syndromes with complex partial seizures, not intractable, without status epilepticus: Secondary | ICD-10-CM

## 2023-09-05 DIAGNOSIS — Z5181 Encounter for therapeutic drug level monitoring: Secondary | ICD-10-CM

## 2023-09-06 LAB — CARBAMAZEPINE LEVEL, TOTAL: Carbamazepine (Tegretol), S: 13.4 ug/mL (ref 4.0–12.0)

## 2023-09-08 ENCOUNTER — Ambulatory Visit: Payer: Self-pay | Admitting: Adult Health

## 2023-09-08 NOTE — Telephone Encounter (Signed)
-----   Message from [facility] sent at 09/08/2023  9:39 AM EDT ----- Carbamazepine  level is slightly elevated. As long as he is not having symptoms such as dizziness, drowsiness or confusion then we will just monitor for now.

## 2023-09-08 NOTE — Telephone Encounter (Signed)
 I called pt and let him know that the results of his carbamazepine  level was slightly elevated, down from 2 wks, but stlll slightly elevated per [facility] NP. She said that if he showed symptoms of dizziness, drowsiness, confusion then will monitor for now.  He stated he is not having any of these symptoms and will let us  know if he does.  Appreciated call back.

## 2023-11-20 ENCOUNTER — Telehealth: Payer: Self-pay | Admitting: Orthopedic Surgery

## 2023-11-20 NOTE — Telephone Encounter (Signed)
 Patient contacted to follow-up regarding left ring finger trigger digit release from February of this year.  He was seen for his 6-week follow-up and reported resolution of his symptoms and return to activities which was excellent.  He was given as needed follow-up at that time.  He reports no ongoing issues, no recurrence of triggering.  Is pleased with his results and will return as needed.  Emmilia Sowder, MD Hand Surgery, [place]

## 2024-02-27 ENCOUNTER — Encounter: Payer: Self-pay | Admitting: Radiology

## 2024-08-27 ENCOUNTER — Ambulatory Visit: Admitting: Adult Health
